# Patient Record
Sex: Female | Born: 1965 | Race: White | Hispanic: No | Marital: Married | Smoking: Never smoker
Health system: Southern US, Community
[De-identification: ages and names within clinical notes are randomized; demographics above are authoritative.]

## PROBLEM LIST (undated history)

## (undated) DIAGNOSIS — J329 Chronic sinusitis, unspecified: Secondary | ICD-10-CM

## (undated) DIAGNOSIS — K219 Gastro-esophageal reflux disease without esophagitis: Secondary | ICD-10-CM

## (undated) DIAGNOSIS — N189 Chronic kidney disease, unspecified: Secondary | ICD-10-CM

## (undated) DIAGNOSIS — I10 Essential (primary) hypertension: Secondary | ICD-10-CM

## (undated) DIAGNOSIS — K5190 Ulcerative colitis, unspecified, without complications: Secondary | ICD-10-CM

## (undated) DIAGNOSIS — F410 Panic disorder [episodic paroxysmal anxiety] without agoraphobia: Secondary | ICD-10-CM

## (undated) DIAGNOSIS — M797 Fibromyalgia: Secondary | ICD-10-CM

## (undated) DIAGNOSIS — F419 Anxiety disorder, unspecified: Secondary | ICD-10-CM

## (undated) DIAGNOSIS — R42 Dizziness and giddiness: Secondary | ICD-10-CM

## (undated) DIAGNOSIS — M069 Rheumatoid arthritis, unspecified: Secondary | ICD-10-CM

## (undated) DIAGNOSIS — M1990 Unspecified osteoarthritis, unspecified site: Secondary | ICD-10-CM

## (undated) DIAGNOSIS — Z86718 Personal history of other venous thrombosis and embolism: Secondary | ICD-10-CM

## (undated) DIAGNOSIS — K589 Irritable bowel syndrome without diarrhea: Secondary | ICD-10-CM

## (undated) DIAGNOSIS — I2699 Other pulmonary embolism without acute cor pulmonale: Secondary | ICD-10-CM

## (undated) DIAGNOSIS — F988 Other specified behavioral and emotional disorders with onset usually occurring in childhood and adolescence: Secondary | ICD-10-CM

## (undated) HISTORY — PX: TONSILLECTOMY: SUR1361

## (undated) HISTORY — DX: Irritable bowel syndrome without diarrhea: K58.9

## (undated) HISTORY — PX: ABDOMINAL HYSTERECTOMY: SHX81

## (undated) HISTORY — DX: Dizziness and giddiness: R42

## (undated) HISTORY — PX: ADENOIDECTOMY: SUR15

## (undated) HISTORY — DX: Personal history of other venous thrombosis and embolism: Z86.718

## (undated) HISTORY — DX: Rheumatoid arthritis, unspecified: M06.9

## (undated) HISTORY — PX: KNEE ARTHROSCOPY: SUR90

## (undated) HISTORY — PX: DIAGNOSTIC LAPAROSCOPY: SUR761

---

## 2006-08-13 HISTORY — PX: NEPHRECTOMY: SHX65

## 2012-01-23 ENCOUNTER — Emergency Department (HOSPITAL_COMMUNITY)
Admission: EM | Admit: 2012-01-23 | Discharge: 2012-01-23 | Disposition: A | Discharge status: Home / Self Care | Payer: BC Managed Care – PPO | Attending: Internal Medicine | Admitting: Internal Medicine

## 2012-01-23 ENCOUNTER — Emergency Department (HOSPITAL_COMMUNITY): Payer: BC Managed Care – PPO

## 2012-01-23 ENCOUNTER — Encounter (HOSPITAL_COMMUNITY): Payer: Self-pay | Admitting: *Deleted

## 2012-01-23 DIAGNOSIS — L02619 Cutaneous abscess of unspecified foot: Secondary | ICD-10-CM | POA: Insufficient documentation

## 2012-01-23 DIAGNOSIS — L03129 Acute lymphangitis of unspecified part of limb: Secondary | ICD-10-CM

## 2012-01-23 DIAGNOSIS — L03039 Cellulitis of unspecified toe: Secondary | ICD-10-CM

## 2012-01-23 DIAGNOSIS — Z79899 Other long term (current) drug therapy: Secondary | ICD-10-CM | POA: Insufficient documentation

## 2012-01-23 DIAGNOSIS — L03115 Cellulitis of right lower limb: Secondary | ICD-10-CM

## 2012-01-23 DIAGNOSIS — F411 Generalized anxiety disorder: Secondary | ICD-10-CM

## 2012-01-23 DIAGNOSIS — B353 Tinea pedis: Secondary | ICD-10-CM

## 2012-01-23 DIAGNOSIS — Z905 Acquired absence of kidney: Secondary | ICD-10-CM

## 2012-01-23 HISTORY — DX: Other pulmonary embolism without acute cor pulmonale: I26.99

## 2012-01-23 HISTORY — DX: Anxiety disorder, unspecified: F41.9

## 2012-01-23 HISTORY — DX: Ulcerative colitis, unspecified, without complications: K51.90

## 2012-01-23 LAB — BASIC METABOLIC PANEL
BUN: 21 mg/dL (ref 6–23)
Creatinine, Ser: 1.18 mg/dL — ABNORMAL HIGH (ref 0.50–1.10)
GFR calc Af Amer: 64 mL/min — ABNORMAL LOW (ref >90)
GFR calc non Af Amer: 55 mL/min — ABNORMAL LOW (ref >90)
Potassium: 4 mEq/L (ref 3.5–5.1)

## 2012-01-23 LAB — DIFFERENTIAL
Basophils Absolute: 0 10*3/uL (ref 0.0–0.1)
Basophils Relative: 0 % (ref 0–1)
Neutro Abs: 6 10*3/uL (ref 1.7–7.7)
Neutrophils Relative %: 63 % (ref 43–77)

## 2012-01-23 LAB — LACTIC ACID, PLASMA: Lactic Acid, Venous: 1.8 mmol/L (ref 0.5–2.2)

## 2012-01-23 LAB — CBC
MCHC: 33.2 g/dL (ref 30.0–36.0)
RDW: 12.4 % (ref 11.5–15.5)

## 2012-01-23 MED ORDER — VANCOMYCIN HCL IN DEXTROSE 1-5 GM/200ML-% IV SOLN
1000.0000 mg | Freq: Once | INTRAVENOUS | Status: AC
  Administered 2012-01-23: 1000 mg via INTRAVENOUS
  Filled 2012-01-23: qty 200

## 2012-01-23 MED ORDER — CLINDAMYCIN PHOSPHATE 600 MG/50ML IV SOLN
600.0000 mg | Freq: Once | INTRAVENOUS | Status: AC
  Administered 2012-01-23: 600 mg via INTRAVENOUS
  Filled 2012-01-23: qty 50

## 2012-01-23 MED ORDER — SODIUM CHLORIDE 0.9 % IV BOLUS (SEPSIS)
500.0000 mL | Freq: Once | INTRAVENOUS | Status: AC
  Administered 2012-01-23: 500 mL via INTRAVENOUS

## 2012-01-23 MED ORDER — SULFAMETHOXAZOLE-TMP DS 800-160 MG PO TABS: 1.0000 | ORAL_TABLET | Freq: Two times a day (BID) | ORAL | Status: DC

## 2012-01-23 MED ORDER — SULFAMETHOXAZOLE-TMP DS 800-160 MG PO TABS
1.0000 | ORAL_TABLET | Freq: Once | ORAL | Status: AC
  Administered 2012-01-23: 1 via ORAL
  Filled 2012-01-23: qty 1

## 2012-01-23 NOTE — ED Notes (Signed)
Dr Orvan Falconer paged for dc papers for said pt. DR Orvan Falconer referred RN to EDP for assistance on needed dc papers.

## 2012-01-23 NOTE — ED Provider Notes (Signed)
History   This chart was scribed for Darlene Anger, DO by [name]. The patient was seen in room [facility]12/[facility]12. Patient's care was started at 1716.   CSN: [id number]  Arrival date & time 01/23/12  1716   First MD Initiated Contact with Patient 01/23/12 1737      Chief Complaint  Patient presents with  . Foot Pain    HPI  Pt seen at 1749.  Per pt and spouse, c/o gradual onset and worsening of persistent right foot "redness" and "swelling" for the past 4 days, worse today.  Pt was eval by her PMD yesterday for same, dx cellulitis, rx doxycycline.  Pt has been taking the antibiotic as rx.  Pt states the redness is now streaking up her leg today.  Denies fevers, no injury, no foot pain.    Past Medical History  Diagnosis Date  . Ulcerative colitis   . Anxiety   . Pulmonary embolism     Past Surgical History  Procedure Date  . Tonsillectomy   . Abdominal hysterectomy   . Nephrectomy     right side    History  Substance Use Topics  . Smoking status: Never Smoker   . Smokeless tobacco: Not on file  . Alcohol Use: No    Review of Systems ROS: Statement: All systems negative except as marked or noted in the HPI; Constitutional: Negative for fever and chills. ; ; Eyes: Negative for eye pain, redness and discharge. ; ; ENMT: Negative for ear pain, hoarseness, nasal congestion, sinus pressure and sore throat. ; ; Cardiovascular: Negative for chest pain, palpitations, diaphoresis, dyspnea and peripheral edema. ; ; Respiratory: Negative for cough, wheezing and stridor. ; ; Gastrointestinal: Negative for nausea, vomiting, diarrhea, abdominal pain, blood in stool, hematemesis, jaundice and rectal bleeding. . ; ; Genitourinary: Negative for dysuria, flank pain and hematuria. ; ; Musculoskeletal: Negative for back pain and neck pain. Negative for swelling and trauma.; ; Skin: +rash. Negative for pruritus, abrasions, blisters, bruising and skin lesion.; ; Neuro: Negative for headache,  lightheadedness and neck stiffness. Negative for weakness, altered level of consciousness , altered mental status, extremity weakness, paresthesias, involuntary movement, seizure and syncope.     Allergies  Penicillins; Shrimp; and Adhesive  Home Medications   Current Outpatient Rx  Name Route Sig Dispense Refill  . ACETAMINOPHEN ER 650 MG PO TBCR Oral Take 1,300 mg by mouth daily as needed.    Marland Kitchen AMITRIPTYLINE HCL 50 MG PO TABS Oral Take 50 mg by mouth at bedtime.    Marland Kitchen CITALOPRAM HYDROBROMIDE 40 MG PO TABS Oral Take 40 mg by mouth at bedtime.    Marland Kitchen DICYCLOMINE HCL 10 MG PO CAPS Oral Take 10 mg by mouth daily as needed.    Marland Kitchen DOXYCYCLINE HYCLATE 100 MG PO CAPS Oral Take 100 mg by mouth 2 (two) times daily.    Marland Kitchen FLUTICASONE PROPIONATE 50 MCG/ACT NA SUSP Nasal Place 2 sprays into the nose daily.    Marland Kitchen LORATADINE-PSEUDOEPHEDRINE ER 10-240 MG PO TB24 Oral Take 1 tablet by mouth daily.    Marland Kitchen LORAZEPAM 0.5 MG PO TABS Oral Take 0.5 mg by mouth 3 (three) times daily as needed.    Marland Kitchen MESALAMINE 800 MG PO TBEC Oral Take 1 tablet by mouth 3 (three) times daily.      BP 118/69  Pulse 97  Temp 98.4 F (36.9 C) (Oral)  Resp 20  Ht 5\' 6"  (1.676 m)  Wt 221 lb (100.245 kg)  BMI 35.67 kg/m2  SpO2 97%  Physical Exam 1750: Physical examination:  Nursing notes reviewed; Vital signs and O2 SAT reviewed;  Constitutional: Well developed, Well nourished, Well hydrated, In no acute distress; Head:  Normocephalic, atraumatic; Eyes: EOMI, PERRL, No scleral icterus; ENMT: Mouth and pharynx normal, Mucous membranes moist; Neck: Supple, Full range of motion, No lymphadenopathy; Cardiovascular: Regular rate and rhythm, No murmur, rub, or gallop; Respiratory: Breath sounds clear & equal bilaterally, No rales, rhonchi, wheezes.  Speaking full sentences with ease, Normal respiratory effort/excursion; Chest: Nontender, Movement normal; Abdomen: Soft, Nontender, Nondistended, Normal bowel sounds; Extremities: Pulses normal,  No tenderness, +small linear moist wound in between right 4th and 5th toes webspace with erythema to both toes, proximal dorsal foot, and streaking up right ankle/lower leg. No calf edema or asymmetry.; Neuro: AA&Ox3, Major CN grossly intact.  Speech clear. No gross focal motor or sensory deficits in extremities.; Skin: Color normal, Warm, Dry.   ED Course  Procedures    MDM  MDM Reviewed: nursing note and vitals Interpretation: labs and x-ray   Results for orders placed during the hospital encounter of 01/23/12  BASIC METABOLIC PANEL      Component Value Range   Sodium 139  135 - 145 mEq/L   Potassium 4.0  3.5 - 5.1 mEq/L   Chloride 101  96 - 112 mEq/L   CO2 27  19 - 32 mEq/L   Glucose, Bld 115 (*) 70 - 99 mg/dL   BUN 21  6 - 23 mg/dL   Creatinine, Ser 1.61 (*) 0.50 - 1.10 mg/dL   Calcium 9.6  8.4 - 09.6 mg/dL   GFR calc non Af Amer 55 (*) >90 mL/min   GFR calc Af Amer 64 (*) >90 mL/min  CBC      Component Value Range   WBC 9.5  4.0 - 10.5 K/uL   RBC 4.45  3.87 - 5.11 MIL/uL   Hemoglobin 13.7  12.0 - 15.0 g/dL   HCT 04.5  40.9 - 81.1 %   MCV 92.8  78.0 - 100.0 fL   MCH 30.8  26.0 - 34.0 pg   MCHC 33.2  30.0 - 36.0 g/dL   RDW 91.4  78.2 - 95.6 %   Platelets 265  150 - 400 K/uL  DIFFERENTIAL      Component Value Range   Neutrophils Relative 63  43 - 77 %   Neutro Abs 6.0  1.7 - 7.7 K/uL   Lymphocytes Relative 29  12 - 46 %   Lymphs Abs 2.7  0.7 - 4.0 K/uL   Monocytes Relative 7  3 - 12 %   Monocytes Absolute 0.6  0.1 - 1.0 K/uL   Eosinophils Relative 1  0 - 5 %   Eosinophils Absolute 0.1  0.0 - 0.7 K/uL   Basophils Relative 0  0 - 1 %   Basophils Absolute 0.0  0.0 - 0.1 K/uL  PROCALCITONIN      Component Value Range   Procalcitonin <0.10    LACTIC ACID, PLASMA      Component Value Range   Lactic Acid, Venous 1.8  0.5 - 2.2 mmol/L   Dg Foot Complete Right 01/23/2012  *RADIOLOGY REPORT*  Clinical Data: Pain, swelling and redness between the fourth and fifth toes.   RIGHT FOOT COMPLETE - 3+ VIEW  Comparison: None.  Findings: No bony destructive change or radiopaque foreign body is identified.  No soft tissue gas collection.  First MTP degenerative change is noted.  No fracture.  Accessory ossicle of the navicular bone is seen.  IMPRESSION: No acute finding.  Original Report Authenticated By: Bernadene Bell. D'ALESSIO, M.D.      7829:  IV clindamycin given due to pt's concern for solitary kidney status.  Pt is failure of outpatient PO therapy for her cellulitis (doxycycline), with new lymphangitis today.   Needs observation admit for IV abx.  Dx testing d/w pt and family.  Questions answered.  Verb understanding, agreeable to admit.  T/C to [facility] Dr. Orvan Falconer, case discussed, including:  HPI, pertinent PM/SHx, VS/PE, dx testing, ED course and treatment:  Agreeable to come to ED for eval to admit.      I personally performed the services described in this documentation, which was scribed in my presence. The recorded information has been reviewed and considered. Aujanae Mccullum Allison Quarry, DO 01/25/12 1230

## 2012-01-23 NOTE — Consult Note (Signed)
Emergency room consult:  Date of consult: 01/23/2012  Physician requesting the consult: Abel Presto.O.  Consulting physician: Vania Rea M.D.   PCP:  Suzy Bouchard, [facility], [place], [place]  Reason for consult: Cellulitis and lymphangitis of the right little toe, failed outpatient treatment.   Impression Cellulitis of the right little toe with early lymphangitis, failed one day of oral doxycycline Tinea pedis right fourth and fifth toe web space History of anxiety and depression Ulcerative colitis History of nephrectomy for hydronephrosis   Recommendations : Give a dose of vancomycin intravenous prior to discharge from the emergency room  Give a dose of oral Bactrim  prior to discharge from the emergency room  Discharge patient home with prescription for oral Bactrim DS, for 7 days, topical Bactroban,  topical Lotrimin twice daily to be continued  for 4 weeks after the infection has resolved  Advise patient returned to the emergency room tomorrow if infection is not improving Advise patient to see her doctor in one week to have her renal function checked Advised patient to drink plenty liquids while on Bactrim Discuss rationale and plans for these recommendations with the patient  All of these recommendations have been acted on by the consulting physician    HPI: Darlene Schwartz is an 46 y.o. female.   Obese Caucasian in fair health, has had pain redness and swelling of the right great toe for the past 4 days. He was seen by her primary care physician yesterday and started on doxycycline twice daily, and so far has taken a total of 3 doses. She comes to the emergency room today because the pain and redness has spread from the right little toe she is having streaks going up the right foot. Denies any tenderness or swelling in the popliteal area or the groin. She also notes a crack in the web space between right fourth and fifth toe. He has had no trauma to the  area.  In the emergency room patient has been treated with Cleocin, history of penicillin allergy, and the hospitalist service has been consulted for admission for management of cellulitis with failed outpatient management.  Rewiew of Systems:  The patient denies anorexia, fever, weight loss,, vision loss, decreased hearing, hoarseness, chest pain, syncope, dyspnea on exertion, peripheral edema, balance deficits, hemoptysis, abdominal pain, melena, hematochezia, severe indigestion/heartburn, hematuria, incontinence, genital sores, muscle weakness, suspicious skin lesions, transient blindness, difficulty walking, depression, unusual weight change, abnormal bleeding, enlarged lymph nodes, angioedema, and breast masses.   Past Medical History  Diagnosis Date  . Ulcerative colitis   . Anxiety   . Pulmonary embolism     Past Surgical History  Procedure Date  . Tonsillectomy   . Abdominal hysterectomy   . Nephrectomy     right side    Medications:  HOME MEDS: Prior to Admission medications   Medication Sig Start Date End Date Taking? Authorizing Provider  acetaminophen (TYLENOL) 650 MG CR tablet Take 1,300 mg by mouth daily as needed.   Yes Historical Provider, MD  amitriptyline (ELAVIL) 50 MG tablet Take 50 mg by mouth at bedtime.   Yes Historical Provider, MD  citalopram (CELEXA) 40 MG tablet Take 40 mg by mouth at bedtime.   Yes Historical Provider, MD  dicyclomine (BENTYL) 10 MG capsule Take 10 mg by mouth daily as needed.   Yes Historical Provider, MD  doxycycline (VIBRAMYCIN) 100 MG capsule Take 100 mg by mouth 2 (two) times daily. 01/22/12  Yes Historical Provider, MD  fluticasone (  FLONASE) 50 MCG/ACT nasal spray Place 2 sprays into the nose daily.   Yes Historical Provider, MD  loratadine-pseudoephedrine (CLARITIN-D 24-HOUR) 10-240 MG per 24 hr tablet Take 1 tablet by mouth daily.   Yes Historical Provider, MD  LORazepam (ATIVAN) 0.5 MG tablet Take 0.5 mg by mouth 3 (three) times  daily as needed.   Yes Historical Provider, MD  Mesalamine (ASACOL HD) 800 MG TBEC Take 1 tablet by mouth 3 (three) times daily.   Yes Historical Provider, MD     Allergies:  Allergies  Allergen Reactions  . Penicillins Itching  . Shrimp (Shellfish Allergy) Itching  . Adhesive (Tape) Rash    Social History:   reports that she has never smoked. She does not have any smokeless tobacco history on file. She reports that she does not drink alcohol or use illicit drugs.  Family History: No family history on file.   Physical Exam: Filed Vitals:   01/23/12 1724 01/23/12 1933  BP: 118/69 110/73  Pulse: 97 79  Temp: 98.4 F (36.9 C)   TempSrc: Oral   Resp: 20 20  Height: 5\' 6"  (1.676 m)   Weight: 100.245 kg (221 lb)   SpO2: 97% 100%   Blood pressure 110/73, pulse 79, temperature 98.4 F (36.9 C), temperature source Oral, resp. rate 20, height 5\' 6"  (1.676 m), weight 100.245 kg (221 lb), SpO2 100.00%.  GEN:  Pleasant Caucasian lady lying in the stretcher in no acute distress; cooperative with exam PSYCH:  alert and oriented x4; does appear a little anxious but affect is appropriate. HEENT: Mucous membranes pink and anicteric; PERRLA; EOM intact; no cervical lymphadenopathy nor thyromegaly or carotid bruit; no JVD; Breasts:: Not examined CHEST WALL: No tenderness CHEST: Normal respiration, clear to auscultation bilaterally HEART: Regular rate and rhythm; no murmurs rubs or gallops BACK: No kyphosis or scoliosis; no CVA tenderness ABDOMEN: Obese, soft non-tender; no masses, no organomegaly, normal abdominal bowel sounds; Rectal Exam: Not done EXTREMITIES: Red swollen tender right little toe; pale red streak up the right foot about 5 cm;  lacerated skin between the right fourth and fifth toe; no leg edema; no inguinal lymphadenopathy  Genitalia: not examined PULSES: 2+ and symmetric SKIN: Normal hydration no rash or ulceration CNS: Cranial nerves 2-12 grossly intact no focal  lateralizing neurologic deficit   Labs & Imaging Results for orders placed during the hospital encounter of 01/23/12 (from the past 48 hour(s))  LACTIC ACID, PLASMA     Status: Normal   Collection Time   01/23/12  6:35 PM      Component Value Range Comment   Lactic Acid, Venous 1.8  0.5 - 2.2 mmol/L   BASIC METABOLIC PANEL     Status: Abnormal   Collection Time   01/23/12  6:40 PM      Component Value Range Comment   Sodium 139  135 - 145 mEq/L    Potassium 4.0  3.5 - 5.1 mEq/L    Chloride 101  96 - 112 mEq/L    CO2 27  19 - 32 mEq/L    Glucose, Bld 115 (*) 70 - 99 mg/dL    BUN 21  6 - 23 mg/dL    Creatinine, Ser 1.61 (*) 0.50 - 1.10 mg/dL    Calcium 9.6  8.4 - 09.6 mg/dL    GFR calc non Af Amer 55 (*) >90 mL/min    GFR calc Af Amer 64 (*) >90 mL/min   CBC     Status: Normal  Collection Time   01/23/12  6:40 PM      Component Value Range Comment   WBC 9.5  4.0 - 10.5 K/uL    RBC 4.45  3.87 - 5.11 MIL/uL    Hemoglobin 13.7  12.0 - 15.0 g/dL    HCT 62.1  30.8 - 65.7 %    MCV 92.8  78.0 - 100.0 fL    MCH 30.8  26.0 - 34.0 pg    MCHC 33.2  30.0 - 36.0 g/dL    RDW 84.6  96.2 - 95.2 %    Platelets 265  150 - 400 K/uL   DIFFERENTIAL     Status: Normal   Collection Time   01/23/12  6:40 PM      Component Value Range Comment   Neutrophils Relative 63  43 - 77 %    Neutro Abs 6.0  1.7 - 7.7 K/uL    Lymphocytes Relative 29  12 - 46 %    Lymphs Abs 2.7  0.7 - 4.0 K/uL    Monocytes Relative 7  3 - 12 %    Monocytes Absolute 0.6  0.1 - 1.0 K/uL    Eosinophils Relative 1  0 - 5 %    Eosinophils Absolute 0.1  0.0 - 0.7 K/uL    Basophils Relative 0  0 - 1 %    Basophils Absolute 0.0  0.0 - 0.1 K/uL   PROCALCITONIN     Status: Normal   Collection Time   01/23/12  6:40 PM      Component Value Range Comment   Procalcitonin <0.10      Dg Foot Complete Right  01/23/2012  *RADIOLOGY REPORT*  Clinical Data: Pain, swelling and redness between the fourth and fifth toes.  RIGHT FOOT  COMPLETE - 3+ VIEW  Comparison: None.  Findings: No bony destructive change or radiopaque foreign body is identified.  No soft tissue gas collection.  First MTP degenerative change is noted.  No fracture.  Accessory ossicle of the navicular bone is seen.  IMPRESSION: No acute finding.  Original Report Authenticated By: Bernadene Bell. Maricela Curet, M.D.       Total time spent caring for this patient:: 45 minutes.   Darlene Schwartz 01/23/2012, 8:12 PM

## 2012-01-23 NOTE — ED Notes (Signed)
Right foot infection x 4 days.  Reports redness/swelling in between 4th and 5th right toes.  Seen at [facility] yesterday and given PO abx, pt reports worse today.

## 2012-01-23 NOTE — ED Notes (Addendum)
Spoke with Dr. Orvan Falconer:  Evaluation completed by Dr. Orvan Falconer. (See Consult notes)  Order received to discharge following infusion of IV antibiotics.   Per Dr. Blair Dolphin recommendations, pt to be discharged with prescription for Bactrim for 7 days. Continue Lotrimin cream twice daily.  Follow up with PCP in 1 week.  Return to ED for worsening symptoms.

## 2012-03-13 ENCOUNTER — Encounter (HOSPITAL_COMMUNITY): Payer: Self-pay | Admitting: *Deleted

## 2012-03-13 ENCOUNTER — Other Ambulatory Visit: Payer: Self-pay

## 2012-03-13 ENCOUNTER — Emergency Department (HOSPITAL_COMMUNITY)
Admission: EM | Admit: 2012-03-13 | Discharge: 2012-03-14 | Disposition: A | Discharge status: Home / Self Care | Payer: BC Managed Care – PPO | Attending: Emergency Medicine | Admitting: Emergency Medicine

## 2012-03-13 DIAGNOSIS — F411 Generalized anxiety disorder: Secondary | ICD-10-CM | POA: Insufficient documentation

## 2012-03-13 DIAGNOSIS — E669 Obesity, unspecified: Secondary | ICD-10-CM | POA: Insufficient documentation

## 2012-03-13 DIAGNOSIS — Z79899 Other long term (current) drug therapy: Secondary | ICD-10-CM | POA: Insufficient documentation

## 2012-03-13 DIAGNOSIS — R071 Chest pain on breathing: Secondary | ICD-10-CM | POA: Insufficient documentation

## 2012-03-13 DIAGNOSIS — R0789 Other chest pain: Secondary | ICD-10-CM

## 2012-03-13 DIAGNOSIS — IMO0001 Reserved for inherently not codable concepts without codable children: Secondary | ICD-10-CM | POA: Insufficient documentation

## 2012-03-13 HISTORY — DX: Panic disorder (episodic paroxysmal anxiety): F41.0

## 2012-03-13 HISTORY — DX: Fibromyalgia: M79.7

## 2012-03-13 HISTORY — DX: Chronic sinusitis, unspecified: J32.9

## 2012-03-13 LAB — BASIC METABOLIC PANEL
CO2: 26 mEq/L (ref 19–32)
Calcium: 9.8 mg/dL (ref 8.4–10.5)
GFR calc Af Amer: 71 mL/min — ABNORMAL LOW (ref >90)
Sodium: 140 mEq/L (ref 135–145)

## 2012-03-13 LAB — CBC WITH DIFFERENTIAL/PLATELET
Basophils Absolute: 0 10*3/uL (ref 0.0–0.1)
Basophils Relative: 0 % (ref 0–1)
Eosinophils Relative: 2 % (ref 0–5)
Lymphocytes Relative: 41 % (ref 12–46)
MCV: 92.6 fL (ref 78.0–100.0)
Neutro Abs: 3.2 10*3/uL (ref 1.7–7.7)
Platelets: 292 10*3/uL (ref 150–400)
RDW: 12.9 % (ref 11.5–15.5)
WBC: 6.6 10*3/uL (ref 4.0–10.5)

## 2012-03-13 LAB — URINALYSIS, ROUTINE W REFLEX MICROSCOPIC
Bilirubin Urine: NEGATIVE
Glucose, UA: NEGATIVE mg/dL
Ketones, ur: NEGATIVE mg/dL
pH: 6 (ref 5.0–8.0)

## 2012-03-13 LAB — POCT I-STAT TROPONIN I

## 2012-03-13 LAB — PREGNANCY, URINE: Preg Test, Ur: NEGATIVE

## 2012-03-13 LAB — URINE MICROSCOPIC-ADD ON

## 2012-03-13 NOTE — ED Provider Notes (Signed)
History     CSN: [id number]  Arrival date & time 03/13/12  4098   First MD Initiated Contact with Patient 03/13/12 2309      Chief Complaint  Patient presents with  . Chest Pain    (Consider location/radiation/quality/duration/timing/severity/associated sxs/prior treatment) Patient is a 46 y.o. female presenting with chest pain. The history is provided by the patient. No language interpreter was used.  Chest Pain The chest pain began 1 - 2 weeks ago. Chest pain occurs frequently. The chest pain is worsening. Associated with: none. At its most intense, the pain is at 9/10. The pain is currently at 9/10. The severity of the pain is severe. The quality of the pain is described as sharp. The pain does not radiate. Pertinent negatives for primary symptoms include no fever and no shortness of breath.  Pertinent negatives for associated symptoms include no diaphoresis. She tried nothing for the symptoms. Risk factors include obesity.  Pertinent negatives for past medical history include no MI.  Procedure history is negative for cardiac catheterization.     Past Medical History  Diagnosis Date  . Ulcerative colitis   . Anxiety   . Pulmonary embolism   . Anxiety   . Panic attacks   . Fibromyalgia   . Chronic sinus infection     Past Surgical History  Procedure Date  . Tonsillectomy   . Abdominal hysterectomy   . Nephrectomy     right side    History reviewed. No pertinent family history.  History  Substance Use Topics  . Smoking status: Never Smoker   . Smokeless tobacco: Not on file  . Alcohol Use: No    OB History    Grav Para Term Preterm Abortions TAB SAB Ect Mult Living                  Review of Systems  Constitutional: Negative for fever and diaphoresis.  HENT: Negative for neck pain and neck stiffness.   Respiratory: Negative for shortness of breath.   Cardiovascular: Positive for chest pain.  Hematological: Negative for adenopathy.  All other systems  reviewed and are negative.    Allergies  Penicillins; Shrimp; and Adhesive  Home Medications   Current Outpatient Rx  Name Route Sig Dispense Refill  . ACETAMINOPHEN ER 650 MG PO TBCR Oral Take 1,300 mg by mouth daily as needed.    Marland Kitchen AMITRIPTYLINE HCL 50 MG PO TABS Oral Take 50 mg by mouth at bedtime.    Marland Kitchen VITAMIN D 1000 UNITS PO TABS Oral Take 1,000 Units by mouth daily.    Marland Kitchen CITALOPRAM HYDROBROMIDE 40 MG PO TABS Oral Take 40 mg by mouth at bedtime.    Marland Kitchen DICYCLOMINE HCL 10 MG PO CAPS Oral Take 10 mg by mouth daily as needed.    Marland Kitchen FLUTICASONE PROPIONATE 50 MCG/ACT NA SUSP Nasal Place 2 sprays into the nose daily.    Marland Kitchen LORATADINE-PSEUDOEPHEDRINE ER 10-240 MG PO TB24 Oral Take 1 tablet by mouth daily.    Marland Kitchen LORAZEPAM 0.5 MG PO TABS Oral Take 0.5 mg by mouth 3 (three) times daily as needed.    Marland Kitchen MESALAMINE 800 MG PO TBEC Oral Take 1 tablet by mouth 3 (three) times daily.    . [facility] WORT PO Oral Take 1 tablet by mouth daily.      BP 132/83  Pulse 98  Temp 99.3 F (37.4 C) (Oral)  Resp 16  SpO2 100%  Physical Exam  Constitutional: She is oriented to  person, place, and time. She appears well-developed and well-nourished. No distress.  HENT:  Head: Normocephalic and atraumatic.  Mouth/Throat: Oropharynx is clear and moist.  Eyes: Conjunctivae are normal. Pupils are equal, round, and reactive to light.  Neck: Normal range of motion. Neck supple.  Cardiovascular: Normal rate and regular rhythm.   Pulmonary/Chest: Effort normal and breath sounds normal. She has no wheezes. She has no rales. She exhibits tenderness.  Abdominal: Soft. Bowel sounds are normal. There is no tenderness. There is no rebound and no guarding.  Musculoskeletal: Normal range of motion. She exhibits no edema.  Neurological: She is alert and oriented to person, place, and time. She has normal reflexes.  Skin: Skin is warm and dry. She is not diaphoretic.  Psychiatric: She has a normal mood and affect.    ED  Course  Procedures (including critical care time)  Labs Reviewed  BASIC METABOLIC PANEL - Abnormal; Notable for the following:    GFR calc non Af Amer 61 (*)     GFR calc Af Amer 71 (*)     All other components within normal limits  URINALYSIS, ROUTINE W REFLEX MICROSCOPIC - Abnormal; Notable for the following:    Leukocytes, UA TRACE (*)     All other components within normal limits  CBC WITH DIFFERENTIAL  POCT I-STAT TROPONIN I  URINE MICROSCOPIC-ADD ON  PREGNANCY, URINE  D-DIMER, QUANTITATIVE   No results found.   No diagnosis found.    MDM   Date: 03/13/2012  Rate: 103  Rhythm: sinus tachycardia  QRS Axis: left  Intervals: normal  ST/T Wave abnormalities: normal  Conduction Disutrbances:none  Narrative Interpretation:   Old EKG Reviewed: none available       Clearly MSk pain.  Labs and radiology studies are normal.  There is also an anxiety component.  Return for chest pain, shortness of breath, or any concerns.  Follow up with your family doctor.  Patient verbalizes understanding and agrees to follow up  Arhianna Ebey Smitty Cords, MD 03/14/12 [phone]

## 2012-03-13 NOTE — ED Notes (Signed)
Pt sts pain in her chest, lymp nodes, and extreme anxiety.

## 2012-03-13 NOTE — ED Notes (Signed)
Pt reports x2 weeks intermittent chest heaviness/tightness associated w/ bilat arm tingling. Pt admits to hx of panic attacks and states she feels they have increased in intensity and frequency. Pt takes medication for anxiety however does not feel like it is working, pt states she has woken up in a panic attack and also does not have any known stressors at the onset of panic attacks. Pt in no acute distress at present, resp even and unlabored.

## 2012-03-14 ENCOUNTER — Emergency Department (HOSPITAL_COMMUNITY): Payer: BC Managed Care – PPO

## 2012-03-14 ENCOUNTER — Encounter (HOSPITAL_COMMUNITY): Payer: Self-pay

## 2012-03-14 LAB — POCT I-STAT TROPONIN I: Troponin i, poc: 0 ng/mL (ref 0.00–0.08)

## 2012-03-14 MED ORDER — TECHNETIUM TO 99M ALBUMIN AGGREGATED
3.3000 | Freq: Once | INTRAVENOUS | Status: AC | PRN
  Administered 2012-03-14: 3 via INTRAVENOUS

## 2012-03-14 MED ORDER — ASPIRIN 81 MG PO CHEW
324.0000 mg | CHEWABLE_TABLET | Freq: Once | ORAL | Status: AC
  Administered 2012-03-14: 324 mg via ORAL
  Filled 2012-03-14: qty 4

## 2012-03-14 MED ORDER — IBUPROFEN 600 MG PO TABS: 600.0000 mg | ORAL_TABLET | Freq: Four times a day (QID) | ORAL | Status: AC | PRN

## 2013-01-13 ENCOUNTER — Emergency Department (HOSPITAL_COMMUNITY)
Admission: EM | Admit: 2013-01-13 | Discharge: 2013-01-13 | Disposition: A | Discharge status: Home / Self Care | Payer: 59 | Attending: Emergency Medicine | Admitting: Emergency Medicine

## 2013-01-13 ENCOUNTER — Encounter (HOSPITAL_COMMUNITY): Payer: Self-pay

## 2013-01-13 ENCOUNTER — Emergency Department (HOSPITAL_COMMUNITY): Payer: 59

## 2013-01-13 DIAGNOSIS — IMO0001 Reserved for inherently not codable concepts without codable children: Secondary | ICD-10-CM | POA: Insufficient documentation

## 2013-01-13 DIAGNOSIS — Z79899 Other long term (current) drug therapy: Secondary | ICD-10-CM | POA: Insufficient documentation

## 2013-01-13 DIAGNOSIS — R5381 Other malaise: Secondary | ICD-10-CM | POA: Insufficient documentation

## 2013-01-13 DIAGNOSIS — K5190 Ulcerative colitis, unspecified, without complications: Secondary | ICD-10-CM | POA: Insufficient documentation

## 2013-01-13 DIAGNOSIS — IMO0002 Reserved for concepts with insufficient information to code with codable children: Secondary | ICD-10-CM | POA: Insufficient documentation

## 2013-01-13 DIAGNOSIS — Z86711 Personal history of pulmonary embolism: Secondary | ICD-10-CM | POA: Insufficient documentation

## 2013-01-13 DIAGNOSIS — R5383 Other fatigue: Secondary | ICD-10-CM | POA: Insufficient documentation

## 2013-01-13 DIAGNOSIS — R42 Dizziness and giddiness: Secondary | ICD-10-CM

## 2013-01-13 DIAGNOSIS — F411 Generalized anxiety disorder: Secondary | ICD-10-CM | POA: Insufficient documentation

## 2013-01-13 DIAGNOSIS — Z880 Allergy status to penicillin: Secondary | ICD-10-CM | POA: Insufficient documentation

## 2013-01-13 DIAGNOSIS — Z8709 Personal history of other diseases of the respiratory system: Secondary | ICD-10-CM | POA: Insufficient documentation

## 2013-01-13 LAB — CBC WITH DIFFERENTIAL/PLATELET
Basophils Absolute: 0 10*3/uL (ref 0.0–0.1)
Basophils Relative: 0 % (ref 0–1)
MCHC: 33.6 g/dL (ref 30.0–36.0)
Neutro Abs: 5.4 10*3/uL (ref 1.7–7.7)
Neutrophils Relative %: 66 % (ref 43–77)
RDW: 12.6 % (ref 11.5–15.5)

## 2013-01-13 LAB — COMPREHENSIVE METABOLIC PANEL
AST: 19 U/L (ref 0–37)
Albumin: 3.9 g/dL (ref 3.5–5.2)
Alkaline Phosphatase: 88 U/L (ref 39–117)
Chloride: 102 mEq/L (ref 96–112)
Creatinine, Ser: 1.14 mg/dL — ABNORMAL HIGH (ref 0.50–1.10)
Potassium: 4.1 mEq/L (ref 3.5–5.1)
Total Bilirubin: 0.6 mg/dL (ref 0.3–1.2)

## 2013-01-13 MED ORDER — PREDNISONE 10 MG PO TABS: 20.0000 mg | ORAL_TABLET | Freq: Every day | ORAL | Status: DC

## 2013-01-13 MED ORDER — MECLIZINE HCL 25 MG PO TABS: ORAL_TABLET | ORAL | Status: DC

## 2013-01-13 NOTE — ED Provider Notes (Signed)
History     CSN: [id number]  Arrival date & time 01/13/13  1621   First MD Initiated Contact with Patient 01/13/13 1644      Chief Complaint  Patient presents with  . Dizziness    (Consider location/radiation/quality/duration/timing/severity/associated sxs/prior treatment) Patient is a 47 y.o. female presenting with weakness. The history is provided by the patient (the pt states that she has episodes of dizziness recently.  she states the room spins). No language interpreter was used.  Weakness This is a new problem. The current episode started 3 to 5 hours ago. The problem occurs every several days. The problem has not changed since onset.Pertinent negatives include no chest pain, no abdominal pain and no headaches. Nothing aggravates the symptoms. Nothing relieves the symptoms.    Past Medical History  Diagnosis Date  . Ulcerative colitis   . Anxiety   . Pulmonary embolism   . Anxiety   . Panic attacks   . Fibromyalgia   . Chronic sinus infection     Past Surgical History  Procedure Laterality Date  . Tonsillectomy    . Abdominal hysterectomy    . Nephrectomy      right side    No family history on file.  History  Substance Use Topics  . Smoking status: Never Smoker   . Smokeless tobacco: Not on file  . Alcohol Use: No    OB History   Grav Para Term Preterm Abortions TAB SAB Ect Mult Living                  Review of Systems  Constitutional: Negative for appetite change and fatigue.  HENT: Negative for congestion, sinus pressure and ear discharge.   Eyes: Negative for discharge.  Respiratory: Negative for cough.   Cardiovascular: Negative for chest pain.  Gastrointestinal: Negative for abdominal pain and diarrhea.  Genitourinary: Negative for frequency and hematuria.  Musculoskeletal: Negative for back pain.  Skin: Negative for rash.  Neurological: Positive for weakness. Negative for seizures and headaches.  Psychiatric/Behavioral: Negative for  hallucinations.    Allergies  Penicillins; Shrimp; and Adhesive  Home Medications   Current Outpatient Rx  Name  Route  Sig  Dispense  Refill  . acetaminophen (TYLENOL) 650 MG CR tablet   Oral   Take 1,300 mg by mouth daily as needed for pain.          Marland Kitchen amitriptyline (ELAVIL) 50 MG tablet   Oral   Take 50 mg by mouth at bedtime.         Marland Kitchen atenolol (TENORMIN) 25 MG tablet   Oral   Take 25 mg by mouth at bedtime.         . citalopram (CELEXA) 40 MG tablet   Oral   Take 40 mg by mouth at bedtime.         . dicyclomine (BENTYL) 10 MG capsule   Oral   Take 10 mg by mouth daily as needed.         . fluticasone (FLONASE) 50 MCG/ACT nasal spray   Nasal   Place 2 sprays into the nose daily.         Marland Kitchen loratadine (CLARITIN REDITABS) 10 MG dissolvable tablet   Oral   Take 10 mg by mouth every morning.         Marland Kitchen LORazepam (ATIVAN) 0.5 MG tablet   Oral   Take 0.5 mg by mouth 3 (three) times daily as needed for anxiety.          Marland Kitchen  mesalamine (CANASA) 1000 MG suppository   Rectal   Place 1,000 mg rectally daily as needed.         . mesalamine (PENTASA) 250 MG CR capsule   Oral   Take 500 mg by mouth 2 (two) times daily.         . Pseudoephedrine-APAP-DM (DAYQUIL MULTI-SYMPTOM COLD/FLU PO)   Oral   Take 2 capsules by mouth daily as needed (for symptoms).         . loratadine-pseudoephedrine (CLARITIN-D 24-HOUR) 10-240 MG per 24 hr tablet   Oral   Take 1 tablet by mouth daily.         . meclizine (ANTIVERT) 25 MG tablet      Take one every 6 hours as needed for dizziness   15 tablet   0   . predniSONE (DELTASONE) 10 MG tablet   Oral   Take 2 tablets (20 mg total) by mouth daily.   14 tablet   0     BP 127/81  Pulse 112  Temp(Src) 98.6 F (37 C) (Oral)  Resp 18  Ht 5\' 6"  (1.676 m)  Wt 230 lb (104.327 kg)  BMI 37.14 kg/m2  SpO2 100%  Physical Exam  Constitutional: She is oriented to person, place, and time. She appears  well-developed.  HENT:  Head: Normocephalic.  Eyes: Conjunctivae and EOM are normal. No scleral icterus.  Neck: Neck supple. No thyromegaly present.  Cardiovascular: Normal rate and regular rhythm.  Exam reveals no gallop and no friction rub.   No murmur heard. Pulmonary/Chest: No stridor. She has no wheezes. She has no rales. She exhibits no tenderness.  Abdominal: She exhibits no distension. There is no tenderness. There is no rebound.  Musculoskeletal: Normal range of motion. She exhibits no edema.  Lymphadenopathy:    She has no cervical adenopathy.  Neurological: She is oriented to person, place, and time. Coordination normal.  Skin: No rash noted. No erythema.  Psychiatric: She has a normal mood and affect. Her behavior is normal.    ED Course  Procedures (including critical care time)  Labs Reviewed  COMPREHENSIVE METABOLIC PANEL - Abnormal; Notable for the following:    Glucose, Bld 129 (*)    Creatinine, Ser 1.14 (*)    GFR calc non Af Amer 57 (*)    GFR calc Af Amer 66 (*)    All other components within normal limits  CBC WITH DIFFERENTIAL   Ct Head Wo Contrast  01/13/2013   *RADIOLOGY REPORT*  Clinical Data:  History of intermittent dizziness over 3 weeks.  To spells of dizziness today.  Also complaints of nausea.  CT HEAD WITHOUT CONTRAST  Technique: Contiguous axial images were obtained from the base of the skull through the vertex without contrast  Comparison:  None  Findings:  There is no evidence of brain mass, brain hemorrhage, or acute infarction.  The ventricular system is normal size and shape.  There is no evidence of shift of midline structures, parenchymal lesion, or subdural or epidural hematoma.  The calvarium is intact.  Mastoids are well aerated.  No sinusitis is evident.  IMPRESSION: There is no evidence of brain mass, brain hemorrhage, or acute infarction.  No acute or active process is seen.  No skull lesion is evident. No sinusitis is evident.   Original  Report Authenticated By: Onalee Hua Call     1. Vertigo       MDM          Benny Lennert,  MD 01/13/13 1858

## 2013-01-13 NOTE — ED Notes (Signed)
Pt reports intermittent dizziness for the past 3 weeks.  Today had 2 "dizzy spells"  But says they seem to be getting worse.  Pt became nauseated with the 2nd episode of dizziness today.

## 2014-06-08 IMAGING — CR DG FOOT COMPLETE 3+V*R*
3 series · 3 of 3 positions shown · non-contrast
Comparison: None.

CLINICAL DATA: Pain, swelling and redness between the fourth and
fifth toes.

RIGHT FOOT COMPLETE - 3+ VIEW

[view not recorded (1 of 3)]
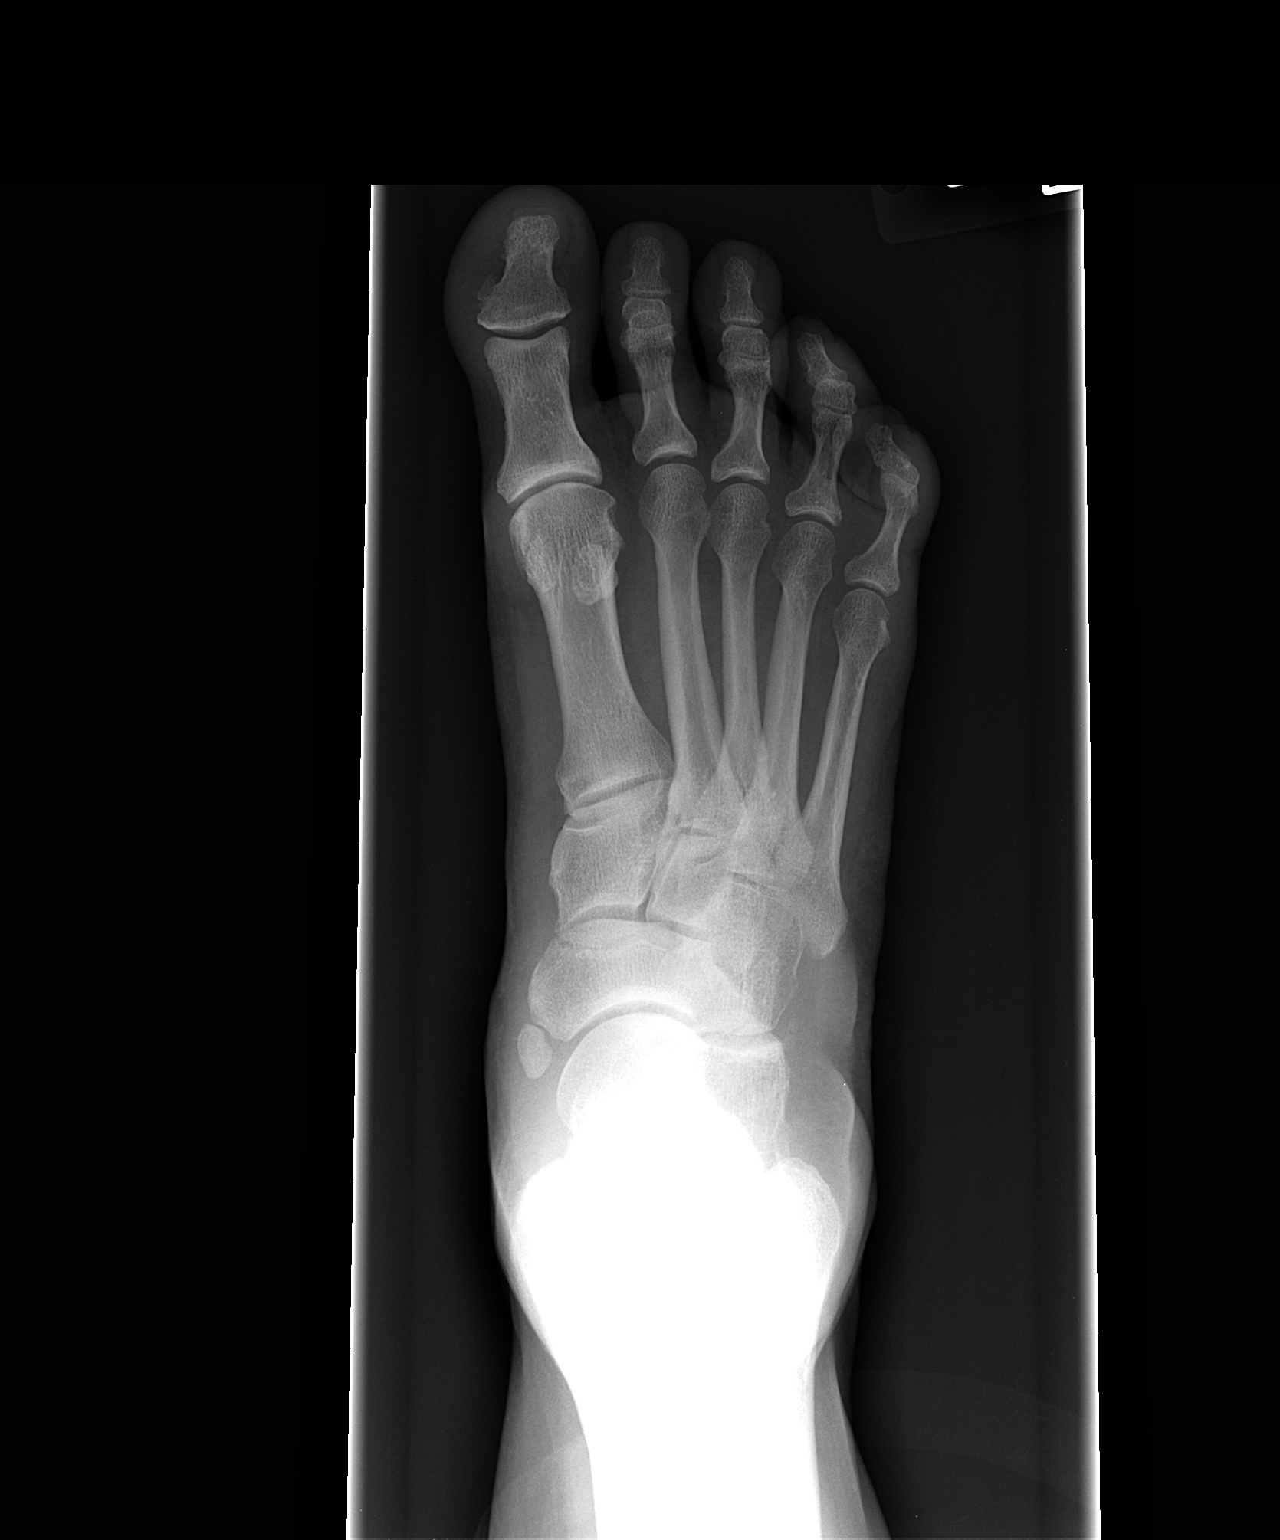

[view not recorded (2 of 3)]
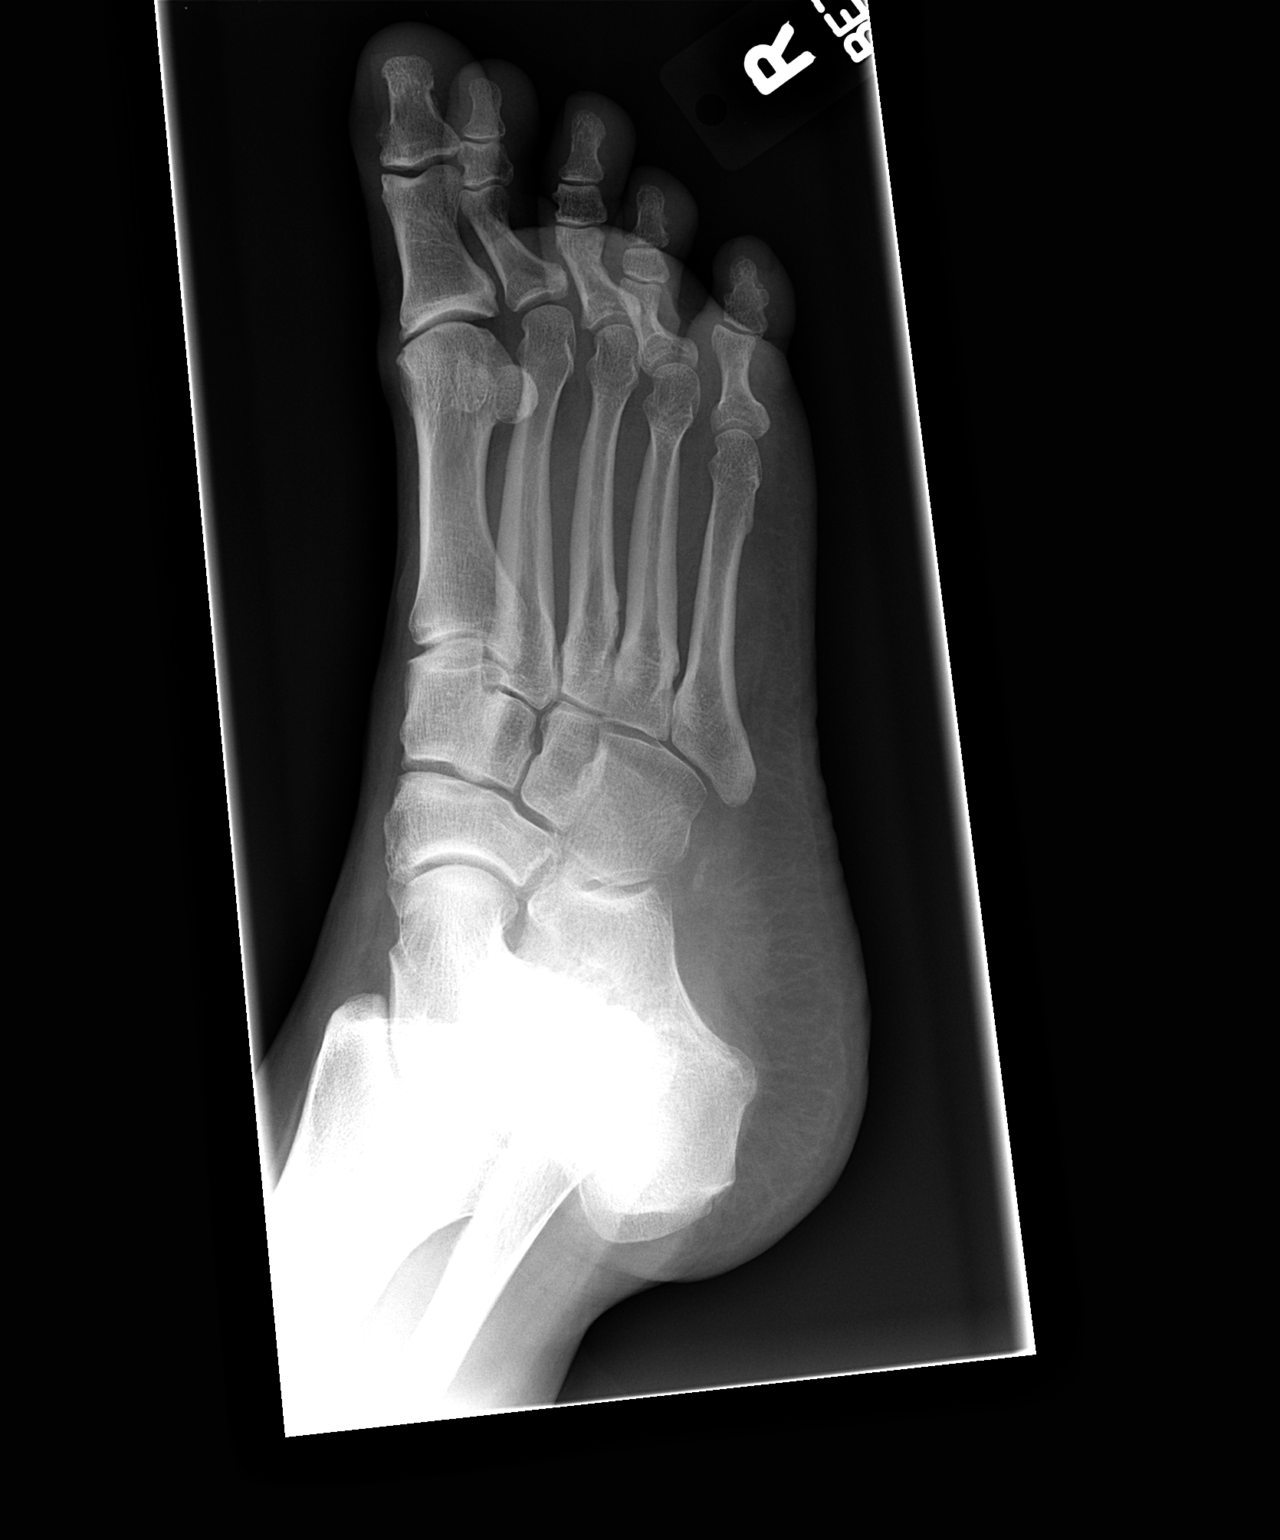

[view not recorded (3 of 3)]
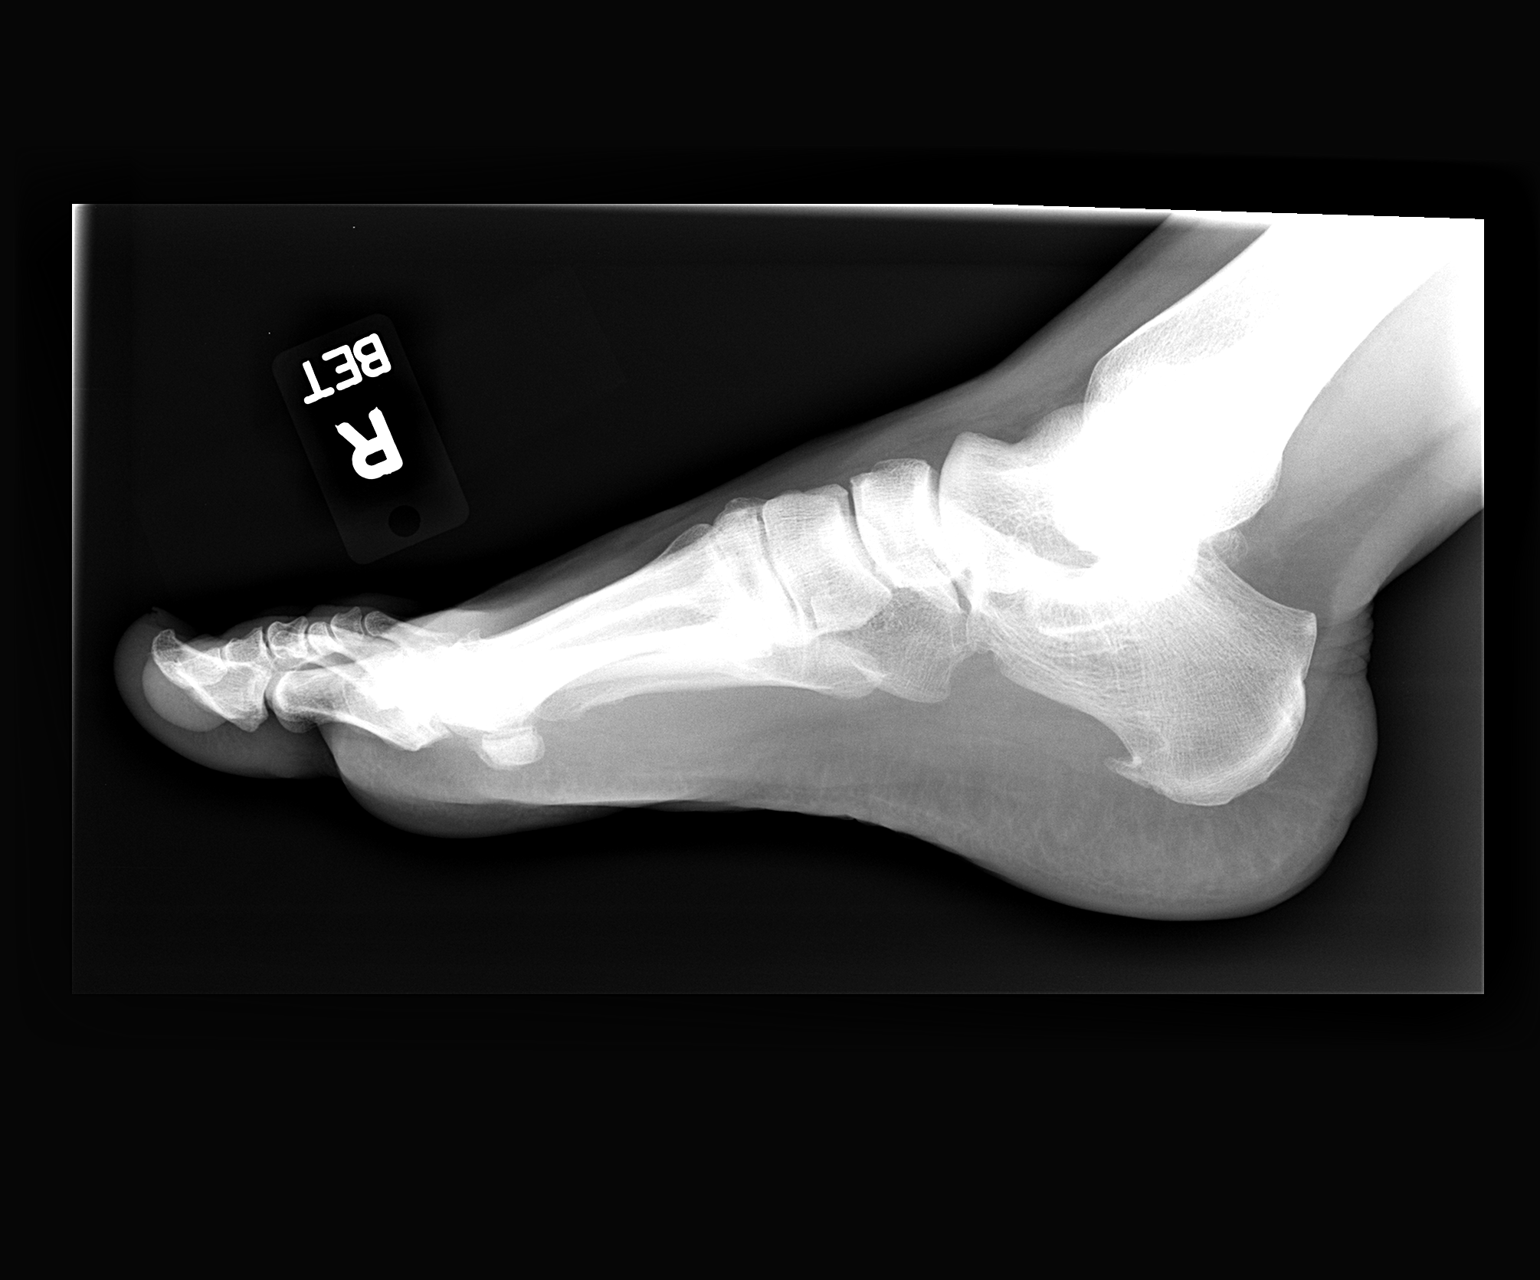

[3 of 3 positions shown; findings below may reference images not displayed]

FINDINGS: No bony destructive change or radiopaque foreign body is
identified.  No soft tissue gas collection.  First MTP degenerative
change is noted.  No fracture.  Accessory ossicle of the navicular
bone is seen.
IMPRESSION: No acute finding.

## 2014-09-23 ENCOUNTER — Ambulatory Visit: Payer: Self-pay | Admitting: Gastroenterology

## 2014-09-27 ENCOUNTER — Ambulatory Visit: Payer: Self-pay | Admitting: Nurse Practitioner

## 2014-09-28 ENCOUNTER — Encounter: Payer: Self-pay | Admitting: Nurse Practitioner

## 2014-10-07 ENCOUNTER — Encounter (INDEPENDENT_AMBULATORY_CARE_PROVIDER_SITE_OTHER): Payer: Self-pay

## 2014-10-07 ENCOUNTER — Ambulatory Visit (INDEPENDENT_AMBULATORY_CARE_PROVIDER_SITE_OTHER): Payer: BLUE CROSS/BLUE SHIELD | Admitting: Nurse Practitioner

## 2014-10-07 ENCOUNTER — Encounter: Payer: Self-pay | Admitting: Nurse Practitioner

## 2014-10-07 VITALS — BP 99/70 | HR 69 | Temp 97.5°F | Ht 66.0 in | Wt 231.2 lb

## 2014-10-07 DIAGNOSIS — K51911 Ulcerative colitis, unspecified with rectal bleeding: Secondary | ICD-10-CM

## 2014-10-07 DIAGNOSIS — K5190 Ulcerative colitis, unspecified, without complications: Secondary | ICD-10-CM | POA: Insufficient documentation

## 2014-10-07 MED ORDER — MESALAMINE 1000 MG RE SUPP: 1000.0000 mg | Freq: Two times a day (BID) | RECTAL | Status: DC

## 2014-10-07 NOTE — Assessment & Plan Note (Signed)
Long-standing ulcerative colitis, currently having a flare of symptoms which responds well to Canasa. Last colonoscopy 2014 consistent with history. Will Rx Canasa 100 mg suppositories bid for 3 weeks to better symptomatic control of UC flare. Follow-up with Dr. Darrick Penna in 4-6 weeks to evaluate long term options for better UC control.

## 2014-10-07 NOTE — Patient Instructions (Signed)
1. Take Canasa suppository twice a day for 3 weeks to get you over the current flare. 2. Follow-up with Dr. Darrick Penna in 3-4 weeks to re-evaluate symptoms and possibly change maintenance medication for better long term control 3. Call if any questions or severe symptoms develop.

## 2014-10-07 NOTE — Progress Notes (Signed)
Primary Care Physician:  Zachery Dauer, MD Primary Gastroenterologist:  Dr. Darrick Penna  Chief Complaint  Patient presents with  . Ulcerative Colitis    Transferring from [place]    HPI:   49 year old female with a history of distal colonic ulcerative colitis presents to establish care. Is changing from [facility] to be with the same GI practice as her mother. States she was diagnosed with UC as a child and has struggled with the disease her whole life. Has a history of nephrectomy as well as other autoimmune manifestations, is seeing Rheumatology for RA. Her disease is limited to the distal colon and rectum, is currently on Asachol. pentasa was attempted but denied by insurance before she could be on it long enough to evaluate effectiveness. Canasa suppositories tend to provide good relief. Currently takes Asachol bid as if she takes it tid it gives her severe headaches. She states her symptoms come and go, is currently in a flare. Is not satisfied with her long term control and she has frequent flares. "I think I'm always somewhat in a flare, but it gets really bad about 4-5 times a year." Her constant symptoms include fecal urgency, up to 4-5 bowel movements in rapid sucession followed by chills, abdominal bloating and discomfort/pressure. Her flare symptoms include increased bloated/bladder discomfort, worsening pain, bleeding with brbpr, and joint pain. Sometimes symptoms are severe enough where she has to stay in bed for half a day to recover. Denies any melena. Previous MD did not provide any change in treatmetn in response to flares, just maintenance of chronic therapy. Takes nexium OTC for GERD as needed, feels her symptoms are well controlled. Occasionally has to take po prednisone for RA which she states helps her UC symptoms, but it's nto a medicine she would want to take long term. Currently denies fever, chills, chest pain, shortness of breath, unintentional weight loss, change in  appetite. Last colonoscopy 2014 with pathology showing chronic inflammatory change of cecum and rectum. Path in 2013 with severe chronic active colitis of rectum with cecal AC, TC with no pathologic changes. Extensive records receive to be reviewed and added as addendum.  Past Medical History  Diagnosis Date  . Ulcerative colitis   . Anxiety   . Pulmonary embolism   . Anxiety   . Panic attacks   . Fibromyalgia   . Chronic sinus infection     Past Surgical History  Procedure Laterality Date  . Tonsillectomy    . Abdominal hysterectomy    . Nephrectomy      right side    Current Outpatient Prescriptions  Medication Sig Dispense Refill  . acetaminophen (TYLENOL) 650 MG CR tablet Take 1,300 mg by mouth daily as needed for pain.     Marland Kitchen amitriptyline (ELAVIL) 50 MG tablet Take 50 mg by mouth at bedtime.    Marland Kitchen atenolol (TENORMIN) 25 MG tablet Take 50 mg by mouth 2 (two) times daily.     . citalopram (CELEXA) 40 MG tablet Take 20 mg by mouth at bedtime.     . dicyclomine (BENTYL) 10 MG capsule Take 10 mg by mouth daily as needed.    Marland Kitchen esomeprazole (NEXIUM) 20 MG capsule Take 20 mg by mouth 2 (two) times daily before a meal.    . fluticasone (FLONASE) 50 MCG/ACT nasal spray Place 2 sprays into the nose daily.    Marland Kitchen loratadine-pseudoephedrine (CLARITIN-D 24-HOUR) 10-240 MG per 24 hr tablet Take 1 tablet by mouth daily.    Marland Kitchen  LORazepam (ATIVAN) 0.5 MG tablet Take 0.5 mg by mouth 3 (three) times daily as needed for anxiety.     . methylphenidate (RITALIN) 5 MG tablet Take 5 mg by mouth 2 (two) times daily.    . NON FORMULARY Asacol 800 mg    One tablet bid    . NON FORMULARY Zana Flex  4 mg     One tablet qhs    . NON FORMULARY Cortizone injections tri monthly   In hip joints    . loratadine (CLARITIN REDITABS) 10 MG dissolvable tablet Take 10 mg by mouth every morning.    . meclizine (ANTIVERT) 25 MG tablet Take one every 6 hours as needed for dizziness (Patient not taking: Reported on  10/07/2014) 15 tablet 0  . mesalamine (CANASA) 1000 MG suppository Place 1,000 mg rectally daily as needed.    . mesalamine (PENTASA) 250 MG CR capsule Take 500 mg by mouth 2 (two) times daily.    . predniSONE (DELTASONE) 10 MG tablet Take 2 tablets (20 mg total) by mouth daily. (Patient taking differently: Take 10 mg by mouth daily. Takes 1/2 of 10 mg tablet daily) 14 tablet 0  . Pseudoephedrine-APAP-DM (DAYQUIL MULTI-SYMPTOM COLD/FLU PO) Take 2 capsules by mouth daily as needed (for symptoms).     No current facility-administered medications for this visit.    Allergies as of 10/07/2014 - Review Complete 10/07/2014  Allergen Reaction Noted  . Penicillins Itching 01/23/2012  . Shrimp [shellfish allergy] Itching 01/23/2012  . Adhesive [tape] Rash 01/23/2012    No family history on file.  History   Social History  . Marital Status: Married    Spouse Name: N/A  . Number of Children: N/A  . Years of Education: N/A   Occupational History  . Not on file.   Social History Main Topics  . Smoking status: Never Smoker   . Smokeless tobacco: Not on file     Comment: Never smoked  . Alcohol Use: No  . Drug Use: No  . Sexual Activity: Yes   Other Topics Concern  . Not on file   Social History Narrative    Review of Systems: All negative except as per HPI.  Physical Exam: BP 99/70 mmHg  Pulse 69  Temp(Src) 97.5 F (36.4 C) (Oral)  Ht 5\' 6"  (1.676 m)  Wt 231 lb 3.2 oz (104.872 kg)  BMI 37.33 kg/m2 General:   Alert and oriented. Pleasant and cooperative. Well-nourished and well-developed.  Head:  Normocephalic and atraumatic. Eyes:  Without icterus, sclera clear and conjunctiva pink.  Ears:  Normal auditory acuity. Mouth:  No deformity or lesions, oral mucosa pink. No OP edema. Neck:  Supple, without mass or thyromegaly. Lungs:  Clear to auscultation bilaterally. No wheezes, rales, or rhonchi. No distress.  Heart:  S1, S2 present without murmurs appreciated.  Abdomen:   +BS, soft, and non-distended. Mild discomfort to abdomen with palpation. No HSM noted. No guarding or rebound. No masses appreciated.  Rectal:  Deferred  Msk:  Symmetrical without gross deformities. Normal posture. Pulses:  Normal pulses noted. Extremities:  Without clubbing or edema. Neurologic:  Alert and  oriented x4;  grossly normal neurologically. Skin:  Intact without significant lesions or rashes. Cervical Nodes:  No significant cervical adenopathy. Psych:  Alert and cooperative. Normal mood and affect.     10/07/2014 11:23 AM

## 2014-10-12 NOTE — Progress Notes (Signed)
CC'ED TO PCP 

## 2014-11-03 ENCOUNTER — Ambulatory Visit (INDEPENDENT_AMBULATORY_CARE_PROVIDER_SITE_OTHER): Payer: BLUE CROSS/BLUE SHIELD | Admitting: Gastroenterology

## 2014-11-03 ENCOUNTER — Other Ambulatory Visit: Payer: Self-pay

## 2014-11-03 ENCOUNTER — Encounter: Payer: Self-pay | Admitting: Gastroenterology

## 2014-11-03 VITALS — BP 109/74 | HR 90 | Temp 97.6°F | Ht 65.0 in | Wt 231.4 lb

## 2014-11-03 DIAGNOSIS — K5190 Ulcerative colitis, unspecified, without complications: Secondary | ICD-10-CM

## 2014-11-03 MED ORDER — NA SULFATE-K SULFATE-MG SULF 17.5-3.13-1.6 GM/177ML PO SOLN: 1.0000 | Freq: Once | ORAL | Status: DC

## 2014-11-03 MED ORDER — DICYCLOMINE HCL 10 MG PO CAPS: 10.0000 mg | ORAL_CAPSULE | Freq: Four times a day (QID) | ORAL | Status: DC | PRN

## 2014-11-03 NOTE — Patient Instructions (Addendum)
TAKE DICYCLOMINE 30 MINUTES PRIOR TO BREAKFAST AND LUNCH THREE DAYS A WEEK. IT MAY CAUSE DROWSINESS, DRY EYES/MOUTH, BLURRY VISION, OR DIFFICULTY URINATING.  USE PEPTO BISMOL AS NEEDED.  COMPLETE COLONOSCOPY WITH PROPOFOL.  FOLLOW UP IN 4 MOS.    Full Liquid Diet 1 day prior to colonoscopy A high-calorie, high-protein supplement should be used to meet your nutritional requirements when the full liquid diet is continued for more than 2 or 3 days. If this diet is to be used for an extended period of time (more than 7 days), a multivitamin should be considered.  Breads and Starches  Allowed: None are allowed except crackers WHOLE OR pureed (made into a thick, smooth soup) in soup.   Avoid: Any others.    Potatoes/Pasta/Rice  Allowed: ANY ITEM AS A SOUP OR SMALL PLATE OF MASHED POTATOES.       Vegetables  Allowed: Strained tomato or vegetable juice. Vegetables pureed in soup.   Avoid: Any others.    Fruit  Allowed: Any strained fruit juices and fruit drinks. Include 1 serving of citrus or vitamin C-enriched fruit juice daily.   Avoid: Any others.  Meat and Meat Substitutes  Allowed: Egg  Avoid: Any meat, fish, or fowl. All cheese.  Milk  Allowed: Milk beverages, including milk shakes and instant breakfast mixes. Smooth yogurt.   Avoid: Any others. Avoid dairy products if not tolerated.    Soups and Combination Foods  Allowed: Broth, strained cream soups. Strained, broth-based soups.   Avoid: Any others.    Desserts and Sweets  Allowed: flavored gelatin,plain ice cream, sherbet, smooth pudding, junket, fruit ices, frozen ice pops, pudding pops,, frozen fudge pops, chocolate syrup. Sugar, honey, jelly, syrup.   Avoid: Any others.  Fats and Oils  Allowed: Margarine, butter, cream, sour cream, oils.   Avoid: Any others.  Beverages  Allowed: All.   Avoid: None.  Condiments  Allowed: Iodized salt, pepper, spices, flavorings. Cocoa powder.   Avoid: Any  others.    SAMPLE MEAL PLAN Breakfast   cup orange juice.   1 OR 2 EGGS   1 cup  milk.   1 cup beverage (coffee or tea).   Cream or sugar, if desired.    Midmorning Snack  2 SCRAMBLED OR HARD BOILED EGG   Lunch  1 cup cream soup.    cup fruit juice.   1 cup milk.    cup custard.   1 cup beverage (coffee or tea).   Cream or sugar, if desired.    Midafternoon Snack  1 cup milk shake.  Dinner  1 cup cream soup.    cup fruit juice.   1 cup milk.    cup pudding.   1 cup beverage (coffee or tea).   Cream or sugar, if desired.  Evening Snack  1 cup supplement.  To increase calories, add sugar, cream, butter, or margarine if possible. Nutritional supplements will also increase the total calories.

## 2014-11-03 NOTE — Assessment & Plan Note (Addendum)
SYMPTOMS NOT IDEALLY CONTROLLED ON CANASA PRN AND ASACOL BID. LAST TCS 2 YRS AGO.  DISCUSSED MANAGEMENT OF ULCERATIVE COLITIS AND COLONOSCOPY. COLONOSCOPY WITH APR 12 W/ MAC DUE TO POLYPHARMACY. CONTINUE ASACOL AND PRN CANASA. TAKE DICYCLOMINE 10 MG TABLETS BEFORE BREAKFAST AND LUNCH 3 DAYS/WEEK.. MED SIDE EFFECTS DISCUSSED. FOLLOW UP IN 4 MOS.

## 2014-11-03 NOTE — Progress Notes (Signed)
Subjective:    Patient ID: Darlene Schwartz, female    DOB: 07-31-1966, 49 y.o.   MRN: [id number]  Zachery Dauer, MD PRIOR GI DOC: [facility]  HPI   Wants to know what she can do with ULCERATIVE COLITIS. TREATED BY RHEUMATOLOGY FOR JOINT PAIN. HAS FLARES WHEN SHE HAS TO STAY IN BED. QUIT TEACHING 3 YRS AGO. BMs: 1-2/DAY BUT CAN HAVE RECTAL URGENCY.  ASACOL BID BECAUSE SHE GETS HEADACHES. CANASA SUPP OFF AND ON. HAS DISTAL DISEASE. LAST TCS 2 YRS AGO. LAST TCS PROPOFOL. SAW BLOOD IN STOOL 1X-1 MO AGO. RARE FORMED STOOL: 1-2X IN PAST MO. WITH A FLARE SHE HAS BLOATING/BLADDER PRESSURE. NEXIUM HELPS FOR REFLUX(BURNING CHEST PAIN). NO WEIGHT LOSS. APPETITE: GOOD. NO SORES IN MOUTH, OR RASH ON LEGS. MAY HAVE SORE RECTUM.  PT DENIES FEVER, CHILLS, HEMATOCHEZIA, HEMATEMESIS, nausea, vomiting, melena, diarrhea, CHEST PAIN, SHORTNESS OF BREATH, CHANGE IN BOWEL IN HABITS, constipation, abdominal pain, problems swallowing, problems with sedation, OR indigestion.  Past Medical History  Diagnosis Date  . Ulcerative colitis   . Anxiety   . Pulmonary embolism   . Anxiety   . Panic attacks   . Fibromyalgia   . Chronic sinus infection   . Vertigo    Past Surgical History  Procedure Laterality Date  . Tonsillectomy    . Abdominal hysterectomy    . Nephrectomy-NONFUNCTIONING      right side-COMPLICATED BY PE   Allergies  Allergen Reactions  . Penicillins Itching  . Shrimp [Shellfish Allergy] Itching  . Adhesive [Tape] Rash    Current Outpatient Prescriptions  Medication Sig Dispense Refill  . acetaminophen (TYLENOL) 650 MG CR tablet Take 1,300 mg by mouth daily as needed for pain.     Marland Kitchen amitriptyline (ELAVIL) 50 MG tablet Take 50 mg by mouth at bedtime.    Marland Kitchen atenolol (TENORMIN) 25 MG tablet Take 50 mg by mouth 2 (two) times daily.     . citalopram (CELEXA) 40 MG tablet Take 20 mg by mouth at bedtime.     . dicyclomine (BENTYL) 10 MG capsule Take 10 mg by mouth daily as needed.    Marland Kitchen esomeprazole  (NEXIUM) 20 MG capsule Take 20 mg by mouth 2 (two) times daily before a meal.    . fluticasone (FLONASE) 50 MCG/ACT nasal spray Place 2 sprays into the nose daily.    Marland Kitchen loratadine (CLARITIN REDITABS) 10 MG dissolvable tablet Take 10 mg by mouth every morning.    . loratadine-pseudoephedrine (CLARITIN-D 24-HOUR) 10-240 MG per 24 hr tablet Take 1 tablet by mouth daily.    Marland Kitchen LORazepam (ATIVAN) 0.5 MG tablet Take 0.5 mg by mouth 3 (three) times daily as needed for anxiety.     . mesalamine (CANASA) 1000 MG suppository Place 1 suppository (1,000 mg total) rectally 2 (two) times daily. Take for 3 weeks PRN   . methylphenidate (RITALIN) 5 MG tablet Take 5 mg by mouth 2 (two) times daily.    . NON FORMULARY Asacol 800 mg    One tablet bid FOR YEARS   . NON FORMULARY Zana Flex  4 mg     One tablet qhs    . NON FORMULARY Cortizone injections tri monthly   In hip joints    . Pseudoephedrine-APAP-DM (DAYQUIL MULTI-SYMPTOM COLD/FLU PO) Take 2 capsules by mouth daily as needed (for symptoms).    . meclizine (ANTIVERT) 25 MG tablet Take one every 6 hours as needed for dizziness (Patient not taking: Reported on 11/03/2014)    .  predniSONE (DELTASONE) 10 MG tablet PRN OFF AND FOR COUPLE MOS FOR SINUS INFECTIONS 2.5 MG QD     Family History  Problem Relation Age of Onset  . Colon cancer Paternal Grandfather 35  . Ulcerative colitis Cousin   . Rheum arthritis Mother   . Rheum arthritis Paternal Grandmother   . Colon polyps Neg Hx     History   Social History  . Marital Status: Married    Spouse Name: N/A  . Number of Children: N/A  . Years of Education: N/A   Occupational History  . Not on file.   Social History Main Topics  . Smoking status: Never Smoker   . Smokeless tobacco: Never Used     Comment: Never smoked  . Alcohol Use: No  . Drug Use: No  . Sexual Activity: Yes   Other Topics Concern  . Not on file   Social History Narrative   ADOPTED TWIN BOYS: AGE 6. MARRIED-4 YRS(2ND  MARRIAGE). 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    . Review of Systems PER HPI OTHERWISE ALL SYSTEMS ARE NEGATIVE.     Objective:   Physical Exam  Constitutional: She is oriented to person, place, and time. She appears well-developed and well-nourished. No distress.  HENT:  Head: Normocephalic and atraumatic.  Mouth/Throat: Oropharynx is clear and moist. No oropharyngeal exudate.  Eyes: Pupils are equal, round, and reactive to light. No scleral icterus.  Neck: Normal range of motion. Neck supple.  Cardiovascular: Normal rate, regular rhythm and normal heart sounds.   Pulmonary/Chest: Effort normal and breath sounds normal. No respiratory distress.  Abdominal: Soft. Bowel sounds are normal. She exhibits no distension. There is no tenderness.  Musculoskeletal: She exhibits no edema.  Lymphadenopathy:    She has no cervical adenopathy.  Neurological: She is alert and oriented to person, place, and time.  NO FOCAL DEFICITS   Psychiatric:  SLIGHTLY ANXIOUS MOOD, NL AFFECT   Vitals reviewed.         Assessment & Plan:

## 2014-11-17 NOTE — Patient Instructions (Signed)
Darlene Schwartz  11/17/2014   Your procedure is scheduled on:   11/23/2014  Report to [facility] at  1000  AM.  Call this number if you have problems the morning of surgery: [phone]   Remember:   Do not eat food or drink liquids after midnight.   Take these medicines the morning of surgery with A SIP OF WATER: atenolol, celexa, bentyl, nexium,claritin, ativan,ritalin   Do not wear jewelry, make-up or nail polish.  Do not wear lotions, powders, or perfumes.   Do not shave 48 hours prior to surgery. Men may shave face and neck.  Do not bring valuables to the hospital.  [facility] is not responsible for any belongings or valuables.               Contacts, dentures or bridgework may not be worn into surgery.  Leave suitcase in the car. After surgery it may be brought to your room.  For patients admitted to the hospital, discharge time is determined by your treatment team.               Patients discharged the day of surgery will not be allowed to drive home.  Name and phone number of your driver: family  Special Instructions: N/A   Please read over the following fact sheets that you were given: Pain Booklet, Coughing and Deep Breathing, Surgical Site Infection Prevention, Anesthesia Post-op Instructions and Care and Recovery After Surgery Colonoscopy A colonoscopy is an exam to look at the entire large intestine (colon). This exam can help find problems such as tumors, polyps, inflammation, and areas of bleeding. The exam takes about 1 hour.  LET [facility] CARE PROVIDER KNOW ABOUT:   Any allergies you have.  All medicines you are taking, including vitamins, herbs, eye drops, creams, and over-the-counter medicines.  Previous problems you or members of your family have had with the use of anesthetics.  Any blood disorders you have.  Previous surgeries you have had.  Medical conditions you have. RISKS AND COMPLICATIONS  Generally, this is a safe procedure. However, as  with any procedure, complications can occur. Possible complications include:  Bleeding.  Tearing or rupture of the colon wall.  Reaction to medicines given during the exam.  Infection (rare). BEFORE THE PROCEDURE   Ask your health care provider about changing or stopping your regular medicines.  You may be prescribed an oral bowel prep. This involves drinking a large amount of medicated liquid, starting the day before your procedure. The liquid will cause you to have multiple loose stools until your stool is almost clear or light green. This cleans out your colon in preparation for the procedure.  Do not eat or drink anything else once you have started the bowel prep, unless your health care provider tells you it is safe to do so.  Arrange for someone to drive you home after the procedure. PROCEDURE   You will be given medicine to help you relax (sedative).  You will lie on your side with your knees bent.  A long, flexible tube with a light and camera on the end (colonoscope) will be inserted through the rectum and into the colon. The camera sends video back to a computer screen as it moves through the colon. The colonoscope also releases carbon dioxide gas to inflate the colon. This helps your health care provider see the area better.  During the exam, your health care provider may take a small  tissue sample (biopsy) to be examined under a microscope if any abnormalities are found.  The exam is finished when the entire colon has been viewed. AFTER THE PROCEDURE   Do not drive for 24 hours after the exam.  You may have a small amount of blood in your stool.  You may pass moderate amounts of gas and have mild abdominal cramping or bloating. This is caused by the gas used to inflate your colon during the exam.  Ask when your test results will be ready and how you will get your results. Make sure you get your test results. Document Released: 07/27/2000 Document Revised: 05/20/2013  Document Reviewed: 04/06/2013 [facility] Patient Information 2015 [place], [place]. This information is not intended to replace advice given to you by your health care provider. Make sure you discuss any questions you have with your health care provider. PATIENT INSTRUCTIONS POST-ANESTHESIA  IMMEDIATELY FOLLOWING SURGERY:  Do not drive or operate machinery for the first twenty four hours after surgery.  Do not make any important decisions for twenty four hours after surgery or while taking narcotic pain medications or sedatives.  If you develop intractable nausea and vomiting or a severe headache please notify your doctor immediately.  FOLLOW-UP:  Please make an appointment with your surgeon as instructed. You do not need to follow up with anesthesia unless specifically instructed to do so.  WOUND CARE INSTRUCTIONS (if applicable):  Keep a dry clean dressing on the anesthesia/puncture wound site if there is drainage.  Once the wound has quit draining you may leave it open to air.  Generally you should leave the bandage intact for twenty four hours unless there is drainage.  If the epidural site drains for more than 36-48 hours please call the anesthesia department.  QUESTIONS?:  Please feel free to call your physician or the hospital operator if you have any questions, and they will be happy to assist you.

## 2014-11-18 ENCOUNTER — Telehealth: Payer: Self-pay | Admitting: Gastroenterology

## 2014-11-18 ENCOUNTER — Other Ambulatory Visit: Payer: Self-pay

## 2014-11-18 MED ORDER — NA SULFATE-K SULFATE-MG SULF 17.5-3.13-1.6 GM/177ML PO SOLN: ORAL | Status: DC

## 2014-11-18 NOTE — Telephone Encounter (Signed)
Pt's husband called to say that their pharmacy does not have patient's prep prescription and we need to send it to [facility] on [address] in [place]. He said it was for Suprep. I told him that I would forward the message.

## 2014-11-18 NOTE — Telephone Encounter (Signed)
SENT ONLINE

## 2014-11-19 ENCOUNTER — Other Ambulatory Visit: Payer: Self-pay

## 2014-11-19 ENCOUNTER — Encounter (HOSPITAL_COMMUNITY)
Admission: RE | Admit: 2014-11-19 | Discharge: 2014-11-19 | Disposition: A | Discharge status: Home / Self Care | Payer: BLUE CROSS/BLUE SHIELD | Source: Ambulatory Visit | Attending: Gastroenterology | Admitting: Gastroenterology

## 2014-11-19 ENCOUNTER — Encounter (HOSPITAL_COMMUNITY): Payer: Self-pay

## 2014-11-19 DIAGNOSIS — Z01818 Encounter for other preprocedural examination: Secondary | ICD-10-CM | POA: Diagnosis not present

## 2014-11-19 HISTORY — DX: Chronic kidney disease, unspecified: N18.9

## 2014-11-19 HISTORY — DX: Essential (primary) hypertension: I10

## 2014-11-19 HISTORY — DX: Gastro-esophageal reflux disease without esophagitis: K21.9

## 2014-11-19 HISTORY — DX: Unspecified osteoarthritis, unspecified site: M19.90

## 2014-11-19 HISTORY — DX: Other specified behavioral and emotional disorders with onset usually occurring in childhood and adolescence: F98.8

## 2014-11-19 LAB — CBC
HCT: 41.4 % (ref 36.0–46.0)
Hemoglobin: 13.5 g/dL (ref 12.0–15.0)
MCH: 31.1 pg (ref 26.0–34.0)
MCHC: 32.6 g/dL (ref 30.0–36.0)
MCV: 95.4 fL (ref 78.0–100.0)
PLATELETS: 290 10*3/uL (ref 150–400)
RBC: 4.34 MIL/uL (ref 3.87–5.11)
RDW: 12.1 % (ref 11.5–15.5)
WBC: 9.6 10*3/uL (ref 4.0–10.5)

## 2014-11-19 LAB — BASIC METABOLIC PANEL
ANION GAP: 8 (ref 5–15)
BUN: 19 mg/dL (ref 6–23)
CALCIUM: 9.1 mg/dL (ref 8.4–10.5)
CO2: 27 mmol/L (ref 19–32)
CREATININE: 1.06 mg/dL (ref 0.50–1.10)
Chloride: 106 mmol/L (ref 96–112)
GFR calc non Af Amer: 61 mL/min — ABNORMAL LOW (ref >90)
GFR, EST AFRICAN AMERICAN: 71 mL/min — AB (ref >90)
Glucose, Bld: 123 mg/dL — ABNORMAL HIGH (ref 70–99)
Potassium: 4.7 mmol/L (ref 3.5–5.1)
SODIUM: 141 mmol/L (ref 135–145)

## 2014-11-19 NOTE — Pre-Procedure Instructions (Signed)
Patient given information to sign up for my chart at home. 

## 2014-11-23 ENCOUNTER — Ambulatory Visit (HOSPITAL_COMMUNITY)
Admission: RE | Admit: 2014-11-23 | Discharge: 2014-11-23 | Disposition: A | Discharge status: Home / Self Care | Payer: BLUE CROSS/BLUE SHIELD | Source: Ambulatory Visit | Attending: Gastroenterology | Admitting: Gastroenterology

## 2014-11-23 ENCOUNTER — Ambulatory Visit (HOSPITAL_COMMUNITY): Payer: BLUE CROSS/BLUE SHIELD | Admitting: Anesthesiology

## 2014-11-23 ENCOUNTER — Encounter (HOSPITAL_COMMUNITY): Payer: Self-pay | Admitting: *Deleted

## 2014-11-23 ENCOUNTER — Encounter (HOSPITAL_COMMUNITY)
Admission: RE | Disposition: A | Discharge status: Home / Self Care | Payer: Self-pay | Source: Ambulatory Visit | Attending: Gastroenterology

## 2014-11-23 DIAGNOSIS — K921 Melena: Secondary | ICD-10-CM | POA: Diagnosis not present

## 2014-11-23 DIAGNOSIS — Z86711 Personal history of pulmonary embolism: Secondary | ICD-10-CM | POA: Diagnosis not present

## 2014-11-23 DIAGNOSIS — Z90710 Acquired absence of both cervix and uterus: Secondary | ICD-10-CM | POA: Insufficient documentation

## 2014-11-23 DIAGNOSIS — K5190 Ulcerative colitis, unspecified, without complications: Secondary | ICD-10-CM | POA: Diagnosis not present

## 2014-11-23 DIAGNOSIS — K644 Residual hemorrhoidal skin tags: Secondary | ICD-10-CM | POA: Insufficient documentation

## 2014-11-23 DIAGNOSIS — K5120 Ulcerative (chronic) proctitis without complications: Secondary | ICD-10-CM | POA: Diagnosis present

## 2014-11-23 DIAGNOSIS — F410 Panic disorder [episodic paroxysmal anxiety] without agoraphobia: Secondary | ICD-10-CM | POA: Insufficient documentation

## 2014-11-23 DIAGNOSIS — Z79899 Other long term (current) drug therapy: Secondary | ICD-10-CM | POA: Insufficient documentation

## 2014-11-23 DIAGNOSIS — Z880 Allergy status to penicillin: Secondary | ICD-10-CM | POA: Diagnosis not present

## 2014-11-23 DIAGNOSIS — Z91013 Allergy to seafood: Secondary | ICD-10-CM | POA: Insufficient documentation

## 2014-11-23 DIAGNOSIS — K625 Hemorrhage of anus and rectum: Secondary | ICD-10-CM | POA: Diagnosis present

## 2014-11-23 DIAGNOSIS — M797 Fibromyalgia: Secondary | ICD-10-CM | POA: Diagnosis not present

## 2014-11-23 HISTORY — PX: COLONOSCOPY WITH PROPOFOL: SHX5780

## 2014-11-23 HISTORY — PX: BIOPSY: SHX5522

## 2014-11-23 SURGERY — COLONOSCOPY WITH PROPOFOL
Anesthesia: Monitor Anesthesia Care

## 2014-11-23 MED ORDER — LIDOCAINE HCL (PF) 1 % IJ SOLN
INTRAMUSCULAR | Status: AC
  Filled 2014-11-23: qty 5

## 2014-11-23 MED ORDER — FENTANYL CITRATE 0.05 MG/ML IJ SOLN
INTRAMUSCULAR | Status: AC
  Filled 2014-11-23: qty 2

## 2014-11-23 MED ORDER — ONDANSETRON HCL 4 MG/2ML IJ SOLN
4.0000 mg | Freq: Once | INTRAMUSCULAR | Status: AC
  Administered 2014-11-23: 4 mg via INTRAVENOUS

## 2014-11-23 MED ORDER — HYDROCORTISONE 100 MG/60ML RE ENEM: 100.0000 mg | ENEMA | Freq: Every day | RECTAL | Status: DC

## 2014-11-23 MED ORDER — FENTANYL CITRATE 0.05 MG/ML IJ SOLN
INTRAMUSCULAR | Status: DC | PRN
  Administered 2014-11-23 (×2): 50 ug via INTRAVENOUS

## 2014-11-23 MED ORDER — FENTANYL CITRATE 0.05 MG/ML IJ SOLN: 25.0000 ug | INTRAMUSCULAR | Status: DC | PRN

## 2014-11-23 MED ORDER — GLYCOPYRROLATE 0.2 MG/ML IJ SOLN
INTRAMUSCULAR | Status: AC
  Filled 2014-11-23: qty 1

## 2014-11-23 MED ORDER — ONDANSETRON HCL 4 MG/2ML IJ SOLN: 4.0000 mg | Freq: Once | INTRAMUSCULAR | Status: AC | PRN

## 2014-11-23 MED ORDER — PROPOFOL INFUSION 10 MG/ML OPTIME
INTRAVENOUS | Status: DC | PRN
  Administered 2014-11-23: 125 ug/kg/min via INTRAVENOUS
  Administered 2014-11-23: 11:00:00 via INTRAVENOUS

## 2014-11-23 MED ORDER — FENTANYL CITRATE 0.05 MG/ML IJ SOLN
25.0000 ug | INTRAMUSCULAR | Status: AC
  Administered 2014-11-23 (×2): 25 ug via INTRAVENOUS

## 2014-11-23 MED ORDER — LACTATED RINGERS IV SOLN
INTRAVENOUS | Status: DC
  Administered 2014-11-23: 1000 mL via INTRAVENOUS

## 2014-11-23 MED ORDER — ONDANSETRON HCL 4 MG/2ML IJ SOLN
INTRAMUSCULAR | Status: AC
  Filled 2014-11-23: qty 2

## 2014-11-23 MED ORDER — MIDAZOLAM HCL 5 MG/5ML IJ SOLN
INTRAMUSCULAR | Status: DC | PRN
  Administered 2014-11-23 (×2): 1 mg via INTRAVENOUS

## 2014-11-23 MED ORDER — MIDAZOLAM HCL 2 MG/2ML IJ SOLN
INTRAMUSCULAR | Status: AC
  Filled 2014-11-23: qty 2

## 2014-11-23 MED ORDER — STERILE WATER FOR IRRIGATION IR SOLN
Status: DC | PRN
  Administered 2014-11-23: 1000 mL

## 2014-11-23 MED ORDER — LIDOCAINE HCL (CARDIAC) 10 MG/ML IV SOLN
INTRAVENOUS | Status: DC | PRN
  Administered 2014-11-23: 25 mg via INTRAVENOUS

## 2014-11-23 MED ORDER — GLYCOPYRROLATE 0.2 MG/ML IJ SOLN
0.2000 mg | Freq: Once | INTRAMUSCULAR | Status: AC
  Administered 2014-11-23: 0.2 mg via INTRAVENOUS

## 2014-11-23 MED ORDER — LACTATED RINGERS IV SOLN
INTRAVENOUS | Status: DC | PRN
  Administered 2014-11-23: 10:00:00 via INTRAVENOUS

## 2014-11-23 MED ORDER — MIDAZOLAM HCL 2 MG/2ML IJ SOLN
1.0000 mg | INTRAMUSCULAR | Status: DC | PRN
  Administered 2014-11-23: 2 mg via INTRAVENOUS

## 2014-11-23 SURGICAL SUPPLY — 23 items
ELECT REM PT RETURN 9FT ADLT (ELECTROSURGICAL)
ELECTRODE REM PT RTRN 9FT ADLT (ELECTROSURGICAL) IMPLANT
FCP BXJMBJMB 240X2.8X (CUTTING FORCEPS)
FLOOR PAD 36X40 (MISCELLANEOUS) ×4
FORCEPS BIOP RAD 4 LRG CAP 4 (CUTTING FORCEPS) ×4 IMPLANT
FORCEPS BIOP RJ4 240 W/NDL (CUTTING FORCEPS)
FORCEPS BXJMBJMB 240X2.8X (CUTTING FORCEPS) IMPLANT
FORMALIN 10 PREFIL 20ML (MISCELLANEOUS) ×12 IMPLANT
INJECTOR/SNARE I SNARE (MISCELLANEOUS) IMPLANT
KIT CLEAN ENDO COMPLIANCE (KITS) ×4 IMPLANT
LUBRICANT JELLY 4.5OZ STERILE (MISCELLANEOUS) ×4 IMPLANT
MANIFOLD NEPTUNE II (INSTRUMENTS) ×4 IMPLANT
NEEDLE SCLEROTHERAPY 25GX240 (NEEDLE) IMPLANT
OVERTUBE ENDOCUFF GREEN (MISCELLANEOUS) ×4 IMPLANT
PAD FLOOR 36X40 (MISCELLANEOUS) ×2 IMPLANT
PROBE APC STR FIRE (PROBE) IMPLANT
PROBE INJECTION GOLD (MISCELLANEOUS)
PROBE INJECTION GOLD 7FR (MISCELLANEOUS) IMPLANT
SNARE SHORT THROW 13M SML OVAL (MISCELLANEOUS) IMPLANT
SYR 50ML LL SCALE MARK (SYRINGE) ×4 IMPLANT
TRAP SPECIMEN MUCOUS 40CC (MISCELLANEOUS) IMPLANT
TUBING IRRIGATION ENDOGATOR (MISCELLANEOUS) ×4 IMPLANT
WATER STERILE IRR 1000ML POUR (IV SOLUTION) ×4 IMPLANT

## 2014-11-23 NOTE — Anesthesia Postprocedure Evaluation (Signed)
  Anesthesia Post-op Note  Patient: Darlene Schwartz  Procedure(s) Performed: Procedure(s) with comments: COLONOSCOPY WITH PROPOFOL (N/A) - Cecum time in  1046  time out  1101 total time 15 mintues BIOPSY - Right Colon, Left Colon, Rectal  Patient Location: PACU  Anesthesia Type:MAC  Level of Consciousness: awake, alert , oriented and patient cooperative  Airway and Oxygen Therapy: Patient Spontanous Breathing and Patient connected to nasal cannula oxygen  Post-op Pain: none  Post-op Assessment: Post-op Vital signs reviewed, Patient's Cardiovascular Status Stable, Respiratory Function Stable, Patent Airway, No signs of Nausea or vomiting and Pain level controlled  Post-op Vital Signs: Reviewed and stable  Last Vitals:  Filed Vitals:   11/23/14 1020  BP: 103/65  Temp:   Resp: 22    Complications: No apparent anesthesia complications

## 2014-11-23 NOTE — Interval H&P Note (Signed)
History and Physical Interval Note:  11/23/2014 10:01 AM  Darlene Schwartz  has presented today for surgery, with the diagnosis of ulcerative coliits  The various methods of treatment have been discussed with the patient and family. After consideration of risks, benefits and other options for treatment, the patient has consented to  Procedure(s) with comments: COLONOSCOPY WITH PROPOFOL (N/A) - 1130-moved to 1015 Candy called pt as a surgical intervention .  The patient's history has been reviewed, patient examined, no change in status, stable for surgery.  I have reviewed the patient's chart and labs.  Questions were answered to the patient's satisfaction.     [employer]

## 2014-11-23 NOTE — Transfer of Care (Signed)
Immediate Anesthesia Transfer of Care Note  Patient: Darlene Schwartz  Procedure(s) Performed: Procedure(s) with comments: COLONOSCOPY WITH PROPOFOL (N/A) - Cecum time in  1046  time out  1101 total time 15 mintues BIOPSY - Right Colon, Left Colon, Rectal  Patient Location: PACU  Anesthesia Type:MAC  Level of Consciousness: awake, alert , oriented and patient cooperative  Airway & Oxygen Therapy: Patient Spontanous Breathing and Patient connected to nasal cannula oxygen  Post-op Assessment: Report given to RN and Post -op Vital signs reviewed and stable  Post vital signs: Reviewed and stable  Last Vitals:  Filed Vitals:   11/23/14 1020  BP: 103/65  Temp:   Resp: 22    Complications: No apparent anesthesia complications

## 2014-11-23 NOTE — Discharge Instructions (Signed)
You have MODERATELY ACTIVE PROCTITIS. YOUR COLON AND SMALL BOWEL ARE NORMAL. I BIOPSIED YOUR COLON AND RECTUM.   FOLLOW A HIGH FIBER DIET. AVOID ITEMS THAT CAUSE BLOATING & GAS. SEE INFO BELOW.  STOP ASACOL. CONTINUE CANASA SUPPOSITORIES TWICE DAILY.  START HYDROCORTISONE ENEMA AT BEDTIME FOR 21 DAYS.  TAKE DICYCLOMINE 10 MG TABLETS BEFORE BREAKFAST AND LUNCH 3 DAYS/WEEK. Hold for constipation.  AVOID USING ASPIRIN, BC/GOODY POWDERS,  OR IBUPROFEN, MOTRIN, ALEVE, OR NAPROXEN.   USE TYLENOL  AS NEEDED FOR ABDOMINAL PAIN.   TAKE A PROBIOTIC DAILY FOR TWO MONTHS ([facility], [facility], OR [facility]).   FOLLOW UP IN JUL 2016.  Colonoscopy Care After Read the instructions outlined below and refer to this sheet in the next week. These discharge instructions provide you with general information on caring for yourself after you leave the hospital. While your treatment has been planned according to the most current medical practices available, unavoidable complications occasionally occur. If you have any problems or questions after discharge, call DR. FIELDS, [phone].  ACTIVITY  You may resume your regular activity, but move at a slower pace for the next 24 hours.   Take frequent rest periods for the next 24 hours.   Walking will help get rid of the air and reduce the bloated feeling in your belly (abdomen).   No driving for 24 hours (because of the medicine (anesthesia) used during the test).   You may shower.   Do not sign any important legal documents or operate any machinery for 24 hours (because of the anesthesia used during the test).    NUTRITION  Drink plenty of fluids.   You may resume your normal diet as instructed by your doctor.   Begin with a light meal and progress to your normal diet. Heavy or fried foods are harder to digest and may make you feel sick to your stomach (nauseated).   Avoid alcoholic beverages for 24 hours or as instructed.     MEDICATIONS  You may resume your normal medications.   WHAT YOU CAN EXPECT TODAY  Some feelings of bloating in the abdomen.   Passage of more gas than usual.   Spotting of blood in your stool or on the toilet paper  .  IF YOU HAD POLYPS REMOVED DURING THE COLONOSCOPY:  No aspirin products for 7 days or as instructed.   Eat a soft diet IF YOU HAVE NAUSEA, BLOATING, ABDOMINAL PAIN, OR VOMITING.    FINDING OUT THE RESULTS OF YOUR TEST Not all test results are available during your visit. DR. Darrick Penna WILL CALL YOU WITHIN 14 DAYS OF YOUR PROCEDUE WITH YOUR RESULTS. Do not assume everything is normal if you have not heard from DR. FIELDS, CALL HER OFFICE AT [phone].  SEEK IMMEDIATE MEDICAL ATTENTION AND CALL THE OFFICE: [phone] IF:  You have more than a spotting of blood in your stool.   Your belly is swollen (abdominal distention).   You are nauseated or vomiting.   You have a temperature over 101F.   You have abdominal pain or discomfort that is severe or gets worse throughout the day.   PROCTITIS Proctitis is an inflammation of the lining of the rectum. The rectum is a muscular tube that's connected to the end of your colon. Stool passes through the rectum on its way out of the body. Proctitis can cause rectal pain and the continuous sensation that you need to have a bowel movement. Proctitis symptoms can be short-lived, or they  can become chronic.  Proctitis signs and symptoms may include: A frequent or continuous feeling that you need to have a bowel movement  Rectal bleeding  The passing of mucus through your rectum  Rectal pain  Pain on the left side of your abdomen  A feeling of fullness in your rectum  Diarrhea Pain with bowel movements High-Fiber Diet Fiber is found in fruits, vegetables, and grains. A high-fiber diet encourages the addition of more whole grains, legumes, fruits, and vegetables in your diet. The recommended amount of fiber for  adult males is 38 g per day. For adult females, it is 25 g per day. Pregnant and lactating women should get 28 g of fiber per day. If you have a digestive or bowel problem, ask your caregiver for advice before adding high-fiber foods to your diet. Eat a variety of high-fiber foods instead of only a select few type of foods.  PURPOSE To increase stool bulk. To make bowel movements more regular to prevent constipation. To lower cholesterol. To prevent overeating. WHEN IS THIS DIET USED? It may be used if you have constipation and hemorrhoids. It may be used if you have uncomplicated diverticulosis (intestine condition) and irritable bowel syndrome. It may be used if you need help with weight management. It may be used if you want to add it to your diet as a protective measure against atherosclerosis, diabetes, and cancer. SOURCES OF FIBER Whole-grain breads and cereals. Fruits, such as apples, oranges, bananas, berries, prunes, and pears. Vegetables, such as green peas, carrots, sweet potatoes, beets, broccoli, cabbage, spinach, and artichokes. Legumes, such split peas, soy, lentils. Almonds. FIBER CONTENT IN FOODS Starches and Grains / Dietary Fiber (g) Cheerios, 1 cup / 3 g Corn Flakes cereal, 1 cup / 0.7 g Rice crispy treat cereal, 1 cup / 0.3 g Instant oatmeal (cooked),  cup / 2 g Frosted wheat cereal, 1 cup / 5.1 g Brown, long-grain rice (cooked), 1 cup / 3.5 g White, long-grain rice (cooked), 1 cup / 0.6 g Enriched macaroni (cooked), 1 cup / 2.5 g Legumes / Dietary Fiber (g) Baked beans (canned, plain, or vegetarian),  cup / 5.2 g Kidney beans (canned),  cup / 6.8 g Pinto beans (cooked),  cup / 5.5 g Breads and Crackers / Dietary Fiber (g) Plain or honey graham crackers, 2 squares / 0.7 g Saltine crackers, 3 squares / 0.3 g Plain, salted pretzels, 10 pieces / 1.8 g Whole-wheat bread, 1 slice / 1.9 g White bread, 1 slice / 0.7 g Raisin bread, 1 slice / 1.2 g Plain bagel,  3 oz / 2 g Flour tortilla, 1 oz / 0.9 g Corn tortilla, 1 small / 1.5 g Hamburger or hotdog bun, 1 small / 0.9 g Fruits / Dietary Fiber (g) Apple with skin, 1 medium / 4.4 g Sweetened applesauce,  cup / 1.5 g Banana,  medium / 1.5 g Grapes, 10 grapes / 0.4 g Orange, 1 small / 2.3 g Raisin, 1.5 oz / 1.6 g Melon, 1 cup / 1.4 g Vegetables / Dietary Fiber (g) Green beans (canned),  cup / 1.3 g Carrots (cooked),  cup / 2.3 g Broccoli (cooked),  cup / 2.8 g Peas (cooked),  cup / 4.4 g Mashed potatoes,  cup / 1.6 g Lettuce, 1 cup / 0.5 g Corn (canned),  cup / 1.6 g Tomato,  cup / 1.1 g Document Released: 07/30/2005 Document Revised: 01/29/2012 Document Reviewed: 11/01/2011 [employer] Patient Information 2015 [place], [place]. This information is not  intended to replace advice given to you by your health care provider. Make sure you discuss any questions you have with your health care provider.

## 2014-11-23 NOTE — Anesthesia Procedure Notes (Signed)
Procedure Name: MAC Date/Time: 11/23/2014 10:26 AM Performed by: Pernell Dupre, Wilton Thrall A Pre-anesthesia Checklist: Patient identified, Timeout performed, Emergency Drugs available, Suction available and Patient being monitored Oxygen Delivery Method: Simple face mask

## 2014-11-23 NOTE — Anesthesia Preprocedure Evaluation (Signed)
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 on medications and Pt. on home beta blockers + dysrhythmias (beta blocker) Rhythm:Regular Rate:Normal     Neuro/Psych PSYCHIATRIC DISORDERS Anxiety    GI/Hepatic PUD, GERD-  Controlled and Medicated,  Endo/Other    Renal/GU Renal disease     Musculoskeletal  (+) Fibromyalgia -  Abdominal   Peds  Hematology   Anesthesia Other Findings   Reproductive/Obstetrics                             Anesthesia Physical Anesthesia Plan  ASA: III  Anesthesia Plan: MAC   Post-op Pain Management:    Induction: Intravenous  Airway Management Planned: Simple Face Mask  Additional Equipment:   Intra-op Plan:   Post-operative Plan:   Informed Consent: I have reviewed the patients History and Physical, chart, labs and discussed the procedure including the risks, benefits and alternatives for the proposed anesthesia with the patient or authorized representative who has indicated his/her understanding and acceptance.     Plan Discussed with:   Anesthesia Plan Comments:         Anesthesia Quick Evaluation

## 2014-11-23 NOTE — Op Note (Signed)
[facility] [address] [place], [address]   COLONOSCOPY PROCEDURE REPORT  PATIENT: Darlene, Schwartz  MR#: [id number] BIRTHDATE: 1965/10/07 , 48  yrs. old GENDER: female ENDOSCOPIST: West Bali, MD REFERRED TS:[id number] Milam, M.D. PROCEDURE DATE:  12/03/2014 PROCEDURE:   Colonoscopy with biopsy INDICATIONS:ULCERATIVE PROCTITIS SINCE AGE 49.  OCCASIONAL RECTAL BLEEDING, & SHOOTING RECTAL PAIN.Marland Kitchen MEDICATIONS: Monitored anesthesia care  DESCRIPTION OF PROCEDURE:    Physical exam was performed.  Informed consent was obtained from the patient after explaining the benefits, risks, and alternatives to procedure.  The patient was connected to monitor and placed in left lateral position. Continuous oxygen was provided by nasal cannula and IV medicine administered through an indwelling cannula.  After administration of sedation and rectal exam, the patients rectum was intubated and the     colonoscope was advanced under direct visualization to the ileum.  The scope was removed slowly by carefully examining the color, texture, anatomy, and integrity mucosa on the way out.  The patient was recovered in endoscopy and discharged home in satisfactory condition.    COLON FINDINGS: The examined terminal ileum appeared to be normal. , The colon was redundant.  Manual abdominal counter-pressure was used to reach the cecum, The colonic mucosa appeared normal throughout the entire examined colon.  Multiple biopsies were performed IN THE RIGHT & LEFT COLON. ERYTHEMA AND EDEMA IN THE RECTUM WITHOUT ULCERATION STARTING AT ANAL VERGE AND EXTENDING TO 18-19 CMS.  RANDOM COLD FORCEPS BIOPSIES OBTAINED.    MODERATE TO LARGE  EXTERNAL HEMORRHOIDS.  PREP QUALITY: excellent.  CECAL W/D TIME: 15        minutes COMPLICATIONS: None  ENDOSCOPIC IMPRESSION: 1.   NORMAL ILEUM 2.   The LEFT colon IS redundant 3.   MODERATE PROCTITIS  RECOMMENDATIONS: FOLLOW A HIGH FIBER DIET. STOP ASACOL.   CONTINUE CANASA SUPPOSITORIES TWICE DAILY. USE HCT ENEMA QHS FOR 3 WEEKS. TAKE DICYCLOMINE 10 MG TABLETS BEFORE BREAKFAST AND LUNCH 3 DAYS/WEEK. AVOID USING ASPIRIN, BC/GOODY POWDERS, OR IBUPROFEN, MOTRIN, ALEVE, OR NAPROXEN.  USE TYLENOL AS NEEDED FOR ABDOMINAL PAIN. TAKE A PROBIOTIC DAILY FOR TWO MONTHS. SEE DR.  Lovell Sheehan TO EVALUATE FOR HEMORRHOIDECTOMY IN ONE MONTH. FOLLOW UP IN JUL 2016.   eSigned:  West Bali, MD 12/03/14 11:36 AM   CPT CODES: ICD CODES:  The ICD and CPT codes recommended by this software are interpretations from the data that the clinical staff has captured with the software.  The verification of the translation of this report to the ICD and CPT codes and modifiers is the sole responsibility of the health care institution and practicing physician where this report was generated.  [employer] will not be held responsible for the validity of the ICD and CPT codes included on this report.  AMA assumes no liability for data contained or not contained herein. CPT is a Publishing rights manager of the [employer].

## 2014-11-23 NOTE — H&P (View-Only) (Signed)
Subjective:    Patient ID: Darlene Schwartz, female    DOB: 07-31-1966, 49 y.o.   MRN: [id number]  Zachery Dauer, MD PRIOR GI DOC: PANDYA  HPI   Wants to know what she can do with ULCERATIVE COLITIS. TREATED BY RHEUMATOLOGY FOR JOINT PAIN. HAS FLARES WHEN SHE HAS TO STAY IN BED. QUIT TEACHING 3 YRS AGO. BMs: 1-2/DAY BUT CAN HAVE RECTAL URGENCY.  ASACOL BID BECAUSE SHE GETS HEADACHES. CANASA SUPP OFF AND ON. HAS DISTAL DISEASE. LAST TCS 2 YRS AGO. LAST TCS PROPOFOL. SAW BLOOD IN STOOL 1X-1 MO AGO. RARE FORMED STOOL: 1-2X IN PAST MO. WITH A FLARE SHE HAS BLOATING/BLADDER PRESSURE. NEXIUM HELPS FOR REFLUX(BURNING CHEST PAIN). NO WEIGHT LOSS. APPETITE: GOOD. NO SORES IN MOUTH, OR RASH ON LEGS. MAY HAVE SORE RECTUM.  PT DENIES FEVER, CHILLS, HEMATOCHEZIA, HEMATEMESIS, nausea, vomiting, melena, diarrhea, CHEST PAIN, SHORTNESS OF BREATH, CHANGE IN BOWEL IN HABITS, constipation, abdominal pain, problems swallowing, problems with sedation, OR indigestion.  Past Medical History  Diagnosis Date  . Ulcerative colitis   . Anxiety   . Pulmonary embolism   . Anxiety   . Panic attacks   . Fibromyalgia   . Chronic sinus infection   . Vertigo    Past Surgical History  Procedure Laterality Date  . Tonsillectomy    . Abdominal hysterectomy    . Nephrectomy-NONFUNCTIONING      right side-COMPLICATED BY PE   Allergies  Allergen Reactions  . Penicillins Itching  . Shrimp [Shellfish Allergy] Itching  . Adhesive [Tape] Rash    Current Outpatient Prescriptions  Medication Sig Dispense Refill  . acetaminophen (TYLENOL) 650 MG CR tablet Take 1,300 mg by mouth daily as needed for pain.     Marland Kitchen amitriptyline (ELAVIL) 50 MG tablet Take 50 mg by mouth at bedtime.    Marland Kitchen atenolol (TENORMIN) 25 MG tablet Take 50 mg by mouth 2 (two) times daily.     . citalopram (CELEXA) 40 MG tablet Take 20 mg by mouth at bedtime.     . dicyclomine (BENTYL) 10 MG capsule Take 10 mg by mouth daily as needed.    Marland Kitchen esomeprazole  (NEXIUM) 20 MG capsule Take 20 mg by mouth 2 (two) times daily before a meal.    . fluticasone (FLONASE) 50 MCG/ACT nasal spray Place 2 sprays into the nose daily.    Marland Kitchen loratadine (CLARITIN REDITABS) 10 MG dissolvable tablet Take 10 mg by mouth every morning.    . loratadine-pseudoephedrine (CLARITIN-D 24-HOUR) 10-240 MG per 24 hr tablet Take 1 tablet by mouth daily.    Marland Kitchen LORazepam (ATIVAN) 0.5 MG tablet Take 0.5 mg by mouth 3 (three) times daily as needed for anxiety.     . mesalamine (CANASA) 1000 MG suppository Place 1 suppository (1,000 mg total) rectally 2 (two) times daily. Take for 3 weeks PRN   . methylphenidate (RITALIN) 5 MG tablet Take 5 mg by mouth 2 (two) times daily.    . NON FORMULARY Asacol 800 mg    One tablet bid FOR YEARS   . NON FORMULARY Zana Flex  4 mg     One tablet qhs    . NON FORMULARY Cortizone injections tri monthly   In hip joints    . Pseudoephedrine-APAP-DM (DAYQUIL MULTI-SYMPTOM COLD/FLU PO) Take 2 capsules by mouth daily as needed (for symptoms).    . meclizine (ANTIVERT) 25 MG tablet Take one every 6 hours as needed for dizziness (Patient not taking: Reported on 11/03/2014)    .  predniSONE (DELTASONE) 10 MG tablet PRN OFF AND FOR COUPLE MOS FOR SINUS INFECTIONS 2.5 MG QD     Family History  Problem Relation Age of Onset  . Colon cancer Paternal Grandfather 60  . Ulcerative colitis Cousin   . Rheum arthritis Mother   . Rheum arthritis Paternal Grandmother   . Colon polyps Neg Hx     History   Social History  . Marital Status: Married    Spouse Name: N/A  . Number of Children: N/A  . Years of Education: N/A   Occupational History  . Not on file.   Social History Main Topics  . Smoking status: Never Smoker   . Smokeless tobacco: Never Used     Comment: Never smoked  . Alcohol Use: No  . Drug Use: No  . Sexual Activity: Yes   Other Topics Concern  . Not on file   Social History Narrative   ADOPTED TWIN BOYS: AGE 22. MARRIED-4 YRS(2ND  MARRIAGE). 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    . Review of Systems PER HPI OTHERWISE ALL SYSTEMS ARE NEGATIVE.     Objective:   Physical Exam  Constitutional: She is oriented to person, place, and time. She appears well-developed and well-nourished. No distress.  HENT:  Head: Normocephalic and atraumatic.  Mouth/Throat: Oropharynx is clear and moist. No oropharyngeal exudate.  Eyes: Pupils are equal, round, and reactive to light. No scleral icterus.  Neck: Normal range of motion. Neck supple.  Cardiovascular: Normal rate, regular rhythm and normal heart sounds.   Pulmonary/Chest: Effort normal and breath sounds normal. No respiratory distress.  Abdominal: Soft. Bowel sounds are normal. She exhibits no distension. There is no tenderness.  Musculoskeletal: She exhibits no edema.  Lymphadenopathy:    She has no cervical adenopathy.  Neurological: She is alert and oriented to person, place, and time.  NO FOCAL DEFICITS   Psychiatric:  SLIGHTLY ANXIOUS MOOD, NL AFFECT   Vitals reviewed.         Assessment & Plan:   

## 2014-11-24 ENCOUNTER — Encounter (HOSPITAL_COMMUNITY): Payer: Self-pay | Admitting: Gastroenterology

## 2014-11-30 ENCOUNTER — Telehealth: Payer: Self-pay | Admitting: Gastroenterology

## 2014-11-30 ENCOUNTER — Other Ambulatory Visit: Payer: Self-pay

## 2014-11-30 DIAGNOSIS — K649 Unspecified hemorrhoids: Secondary | ICD-10-CM

## 2014-11-30 NOTE — Telephone Encounter (Signed)
OV MADE °

## 2014-11-30 NOTE — Telephone Encounter (Signed)
Pt is aware of results and OK to refer to Dr. Lovell Sheehan.

## 2014-11-30 NOTE — Telephone Encounter (Signed)
Please call pt. Her right and left colon biopsies are normal. SHE HAS INFLAMMATION THAT IS MILD AND LIMITED TO HER RECTUM. HER LOOSE STOOLS MAY BE DUE TO IBS, OR SMALL BOWEL BACTERIAL OVERGROWTH(SBBO).   FOLLOW A HIGH FIBER DIET. AVOID ITEMS THAT CAUSE BLOATING & GAS.   CONTINUE CANASA SUPPOSITORIES TWICE DAILY.  COMPLETE HYDROCORTISONE ENEMA AT BEDTIME FOR 21 DAYS.  TAKE DICYCLOMINE 10 MG TABLETS BEFORE BREAKFAST AND LUNCH 3 DAYS/WEEK. Hold for constipation.  AVOID USING ASPIRIN, BC/GOODY POWDERS,  OR IBUPROFEN, MOTRIN, ALEVE, OR NAPROXEN.   USE TYLENOL  AS NEEDED FOR ABDOMINAL PAIN.   TAKE A PROBIOTIC DAILY FOR TWO MONTHS UNIL JUN 20.  SEE DR. Lovell Sheehan IN MAY 2016 TO EVALUATE FOR HEMORRHOIDECTOMY.  FOLLOW UP IN JUL 2016 E30 HEMORRHOIDS/ULCERATIVE PROCTITIS.

## 2014-12-01 NOTE — Telephone Encounter (Signed)
Spoke with pt she is aware of appointment °

## 2014-12-01 NOTE — Telephone Encounter (Signed)
Tried calling all numbers on pt demographics. No answer. LMOM for pt to call us back

## 2014-12-01 NOTE — Telephone Encounter (Signed)
Pt has appointment with Dr. Lovell Sheehan on 12/23/2014 @ 9:45am

## 2014-12-24 ENCOUNTER — Encounter: Payer: Self-pay | Admitting: Gastroenterology

## 2015-02-22 ENCOUNTER — Ambulatory Visit: Payer: BLUE CROSS/BLUE SHIELD | Admitting: Gastroenterology

## 2015-03-11 ENCOUNTER — Ambulatory Visit (INDEPENDENT_AMBULATORY_CARE_PROVIDER_SITE_OTHER): Payer: BLUE CROSS/BLUE SHIELD | Admitting: Gastroenterology

## 2015-03-11 ENCOUNTER — Encounter: Payer: Self-pay | Admitting: Gastroenterology

## 2015-03-11 VITALS — BP 116/83 | HR 70 | Temp 97.6°F | Ht 66.0 in | Wt 242.8 lb

## 2015-03-11 DIAGNOSIS — K51911 Ulcerative colitis, unspecified with rectal bleeding: Secondary | ICD-10-CM | POA: Diagnosis not present

## 2015-03-11 MED ORDER — MESALAMINE 1000 MG RE SUPP: 1000.0000 mg | Freq: Every day | RECTAL | Status: DC

## 2015-03-11 NOTE — Patient Instructions (Signed)
1. Complete hydrocortisone enemas if possible. 2. Start back on Canasa suppositories once daily at bedtime.  3. Call if you diarrhea does not improve. 4. Return to the office in 6 months to see Dr. Darrick Penna.

## 2015-03-11 NOTE — Assessment & Plan Note (Signed)
No rectal bleeding. Multiple stools daily. Favor symptoms related to IBS rather than active UC given endoscopic findings in April and lack of rectal bleeding/tenemus. Patient has been off therapy for few months by mistake.  Resume Canasa suppositories 1000 mg at bedtime. Starting once daily as opposed to recommended twice a day per Dr. Darrick Penna per patient's request. Patient will call a couple weeks and let me know how her diarrhea is doing.  Continue Bentyl as before.   Consider interval flex sig every 1-2 years and next TCS in 5-10 years per Dr. Darrick Penna.  Return to the office in six months to see Dr. Darrick Penna.

## 2015-03-11 NOTE — Progress Notes (Signed)
Primary Care Physician: Zachery Dauer, MD  Primary Gastroenterologist:  Jonette Eva, MD   Chief Complaint  Patient presents with  . Follow-up    HPI: Darlene Schwartz is a 49 y.o. 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 Erythema and edema in the rectum without ulceration starting at the anal verge extending to 18 and 19 cm. Moderate to large external hemorrhoids. Normal ileum. Patient recommended to stop Asacol, continue Canasa suppositories twice a day. Hydrocortisone enemas started for 3 weeks. Added dicyclomine. Sent to Dr. Lovell Sheehan for evaluation for hemorrhoidectomy. Right and left colon biopsies benign. Rectal biopsy showed quiesced and chronic colitis.  Asacol for over 20 years. Really hasn't noticed much change off medication. Used five cortenemas only, too much cramping. Has been out of Canasa for couple of months. States she didn't realize she should still be on it. Cancelled appointment with Dr. Lovell Sheehan because her mom has been ill. Not interested in surgery for hemorrhoids.   Rare brbpr. BMs loose sometimes worse than others. Usually less than 5 per day. No nocturnal symptoms. Bentyl 2-3 times per day, helps some.   Rhematoid arthritis. Has avoided Humira and other regimens due to history of only one kidney.     Current Outpatient Prescriptions  Medication Sig Dispense Refill  . acetaminophen (TYLENOL) 650 MG CR tablet Take 1,300 mg by mouth daily as needed for pain.     Marland Kitchen amitriptyline (ELAVIL) 50 MG tablet Take 50 mg by mouth at bedtime.    Marland Kitchen atenolol (TENORMIN) 25 MG tablet Take 50 mg by mouth 2 (two) times daily.     . citalopram (CELEXA) 40 MG tablet Take 20 mg by mouth at bedtime.     . dicyclomine (BENTYL) 10 MG capsule Take 1 capsule (10 mg total) by mouth 4 (four) times daily as needed. 120 capsule 5  . esomeprazole (NEXIUM) 20 MG capsule Take 20 mg by mouth 2 (two) times daily before a  meal.    . fluticasone (FLONASE) 50 MCG/ACT nasal spray Place 2 sprays into the nose daily.    Marland Kitchen loratadine (CLARITIN REDITABS) 10 MG dissolvable tablet Take 10 mg by mouth every morning.    Marland Kitchen LORazepam (ATIVAN) 0.5 MG tablet Take 0.5 mg by mouth 3 (three) times daily as needed for anxiety.     . methylphenidate (RITALIN) 5 MG tablet Take 5 mg by mouth 2 (two) times daily.    . NON FORMULARY Cortizone injections tri monthly   In hip joints    . tiZANidine (ZANAFLEX) 4 MG tablet Take 4 mg by mouth at bedtime.    . mesalamine (CANASA) 1000 MG suppository Place 1 suppository (1,000 mg total) rectally 2 (two) times daily. Take for 3 weeks (Patient not taking: Reported on 03/11/2015) 42 suppository 0   No current facility-administered medications for this visit.    Allergies as of 03/11/2015 - Review Complete 03/11/2015  Allergen Reaction Noted  . Penicillins Itching 01/23/2012  . Adhesive [tape] Rash 01/23/2012    ROS:  General: Negative for anorexia, weight loss, fever, chills, fatigue, weakness. ENT: Negative for hoarseness, difficulty swallowing , nasal congestion. CV: Negative for chest pain, angina, palpitations, dyspnea on exertion, peripheral edema.  Respiratory: Negative for dyspnea at rest, dyspnea on exertion, cough, sputum, wheezing.  GI: See history of present illness. GU:  Negative for dysuria, hematuria, urinary incontinence, urinary frequency, nocturnal urination.  Endo: Negative for unusual weight change.  Physical Examination:   BP 116/83 mmHg  Pulse 70  Temp(Src) 97.6 F (36.4 C) (Oral)  Ht 5\' 6"  (1.676 m)  Wt 242 lb 12.8 oz (110.133 kg)  BMI 39.21 kg/m2  General: Well-nourished, well-developed in no acute distress.  Eyes: No icterus. Mouth: Oropharyngeal mucosa moist and pink , no lesions erythema or exudate. Lungs: Clear to auscultation bilaterally.  Heart: Regular rate and rhythm, no murmurs rubs or gallops.  Abdomen: Bowel sounds are normal, nontender,  nondistended, no hepatosplenomegaly or masses, no abdominal bruits or hernia , no rebound or guarding.   Extremities: No lower extremity edema. No clubbing or deformities. Neuro: Alert and oriented x 4   Skin: Warm and dry, no jaundice.   Psych: Alert and cooperative, normal mood and affect.  Labs:  Lab Results  Component Value Date   CREATININE 1.06 11/19/2014   BUN 19 11/19/2014   NA 141 11/19/2014   K 4.7 11/19/2014   CL 106 11/19/2014   CO2 27 11/19/2014   Lab Results  Component Value Date   WBC 9.6 11/19/2014   HGB 13.5 11/19/2014   HCT 41.4 11/19/2014   MCV 95.4 11/19/2014   PLT 290 11/19/2014   Patient reports normal LFTs done through rheumatologist.   Imaging Studies: No results found.

## 2015-03-14 NOTE — Progress Notes (Signed)
cc'ed to pcp °

## 2015-05-23 ENCOUNTER — Other Ambulatory Visit: Payer: Self-pay | Admitting: Gastroenterology

## 2015-05-31 ENCOUNTER — Other Ambulatory Visit: Payer: Self-pay

## 2015-05-31 DIAGNOSIS — K51911 Ulcerative colitis, unspecified with rectal bleeding: Secondary | ICD-10-CM

## 2015-05-31 MED ORDER — MESALAMINE 1000 MG RE SUPP: 1000.0000 mg | Freq: Every day | RECTAL | Status: DC

## 2015-06-22 ENCOUNTER — Telehealth: Payer: Self-pay | Admitting: Gastroenterology

## 2015-06-22 NOTE — Telephone Encounter (Signed)
Pt called to make OV the same day as her mother's and is aware of both appointments. She also said her pharmacy has changed to [facility] in [place] and needs another Asacol prescription called in.

## 2015-06-23 NOTE — Telephone Encounter (Signed)
See OV note of 11/03/2014 of Dr. Darrick Penna, she told pt to continue Asacol but I do not see strength. Routing to Dr. Darrick Penna to address.

## 2015-06-23 NOTE — Telephone Encounter (Signed)
LMOM for a return call from pt. I do not see Asacol on her med list.

## 2015-06-23 NOTE — Telephone Encounter (Signed)
Per Darlene Schwartz, pt called and said she is on Asacol 800 HD.

## 2015-06-27 NOTE — Telephone Encounter (Signed)
LMOM for a return call.  

## 2015-06-27 NOTE — Telephone Encounter (Addendum)
PLEASE CALL PT. DOES SHE NEED ASACOL OR CANASA. I HAVE NEVER RX ASACOL HD. HER LAST 2 VISIT SHE WAS ON CANASA SUPP FOR MILD PROCTITIS. THE LAST TIME SHE WAS NOTED TO BE ON ASACOL HD WAS 2014. IN MAR 2016 SHE WAS NOTED TO BE TAKING ASACOL HD BID.

## 2015-06-28 ENCOUNTER — Ambulatory Visit (INDEPENDENT_AMBULATORY_CARE_PROVIDER_SITE_OTHER): Payer: BLUE CROSS/BLUE SHIELD | Admitting: Gastroenterology

## 2015-06-28 ENCOUNTER — Encounter: Payer: Self-pay | Admitting: Gastroenterology

## 2015-06-28 VITALS — BP 113/75 | HR 77 | Temp 97.6°F | Ht 66.0 in | Wt 240.4 lb

## 2015-06-28 DIAGNOSIS — K5120 Ulcerative (chronic) proctitis without complications: Secondary | ICD-10-CM | POA: Diagnosis not present

## 2015-06-28 MED ORDER — MESALAMINE 1.2 G PO TBEC: 4.8000 g | DELAYED_RELEASE_TABLET | Freq: Every day | ORAL | Status: DC

## 2015-06-28 NOTE — Progress Notes (Signed)
Primary Care Physician: Zachery Dauer, MD  Primary Gastroenterologist:  Jonette Eva, MD   Chief Complaint  Patient presents with  . Ulcerative Colitis    HPI: Darlene Schwartz is a 49 y.o. female here for follow-up of ulcerative proctitis. She was last seen in July of this year. Patient reports that she was diagnosed as a teenager. Last colonoscopy in April 2016 with erythema and edema in the rectum without ulceration starting at the anal verge and extending 18-19 cm. Moderate to large external hemorrhoids. Biopsy showed quiescent chronic colitis of the rectum, benign biopsy from the right left colon. At that time she was advised to stop Asacol call per Dr. Darrick Penna, continue Canasa suppository twice a day, hydrocortisone enemas for 3 weeks. She was referred to Dr. Lovell Sheehan for evaluation for hemorrhoidectomy but she was unable to keep that appointment due to her mother's illness. She did not tolerate hydrocortisone enemas due to cramping.  Patient told me that being off of Asacol really didnt make much difference back in July when I saw her but since that time she's had more flares. Patient states on previous colonoscopies throughout the years she has had colitis in other locations of her colon besides her rectum. She wonders if the suppositories are inadequate coverage for her.  She has abdominal pain, multiple locations. She feels a pulling sensation in the right lower quadrant. States she had extensive endometriosis requiring complete hysterectomy when she was in her 78s. Seems to be worse when she has a flare. Also has had increase urinary frequency in the setting of flares but no dysuria, nocturia. Recent urinalysis unremarkable per patient. Bowel movements are more loose, 3 daily, postprandial urgency. No blood in the stool. Bentyl is less effective, she continues to take it 4 times daily. Complains of terrible fatigue. A lot of abdominal cramping. Epigastric and left upper quadrant pain as  well. Really unsure whether related to meals or BMs.  Quit Nexium on her own due to concerns for kidney issues, given she has only one kidney. She saw on TV. Really hasn't had any issues with heartburn.  Avoids humira, worried about suppressing her immune system too much, gets sick with everything already. Also concerns about kidney problems since shey has one kidney remaining. 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   Put back on prednisone for RA, currently on 2.5mg  daily.  Current Outpatient Prescriptions  Medication Sig Dispense Refill  . acetaminophen (TYLENOL) 650 MG CR tablet Take 1,300 mg by mouth daily as needed for pain.     Marland Kitchen amitriptyline (ELAVIL) 50 MG tablet Take 50 mg by mouth at bedtime.    Marland Kitchen atenolol (TENORMIN) 25 MG tablet Take 50 mg by mouth 2 (two) times daily.     . citalopram (CELEXA) 40 MG tablet Take 20 mg by mouth at bedtime.     . dicyclomine (BENTYL) 10 MG capsule TAKE 1 CAPSULE (10 MG TOTAL) BY MOUTH 4 (FOUR) TIMES DAILY AS NEEDED. 120 capsule 5  . fluticasone (FLONASE) 50 MCG/ACT nasal spray Place 2 sprays into the nose daily.    Marland Kitchen loratadine (CLARITIN REDITABS) 10 MG dissolvable tablet Take 10 mg by mouth every morning.    Marland Kitchen LORazepam (ATIVAN) 0.5 MG tablet Take 0.5 mg by mouth 3 (three) times daily as needed for anxiety.     . mesalamine (CANASA) 1000 MG suppository Place 1 suppository (1,000 mg total) rectally at bedtime. 30 suppository 5  . methylphenidate (  RITALIN) 5 MG tablet Take 5 mg by mouth 2 (two) times daily.    . NON FORMULARY Cortizone injections tri monthly   In hip joints    . predniSONE (DELTASONE) 2.5 MG tablet Take 2.5 mg by mouth daily with breakfast.    . tiZANidine (ZANAFLEX) 4 MG tablet Take 4 mg by mouth at bedtime.     No current facility-administered medications for this visit.    Allergies as of 06/28/2015 - Review Complete 06/28/2015  Allergen Reaction Noted  . Penicillins Itching 01/23/2012  .  Adhesive [tape] Rash 01/23/2012   Past Medical History  Diagnosis Date  . Ulcerative colitis (HCC)   . Anxiety   . Pulmonary embolism (HCC)     occured after nephrectomy; was on blood thinners for about 6 months post nephrectomy.  Marland Kitchen Anxiety   . Panic attacks   . Fibromyalgia   . Chronic sinus infection   . Vertigo   . Hypertension   . ADD (attention deficit disorder)   . Chronic kidney disease     right nephrectomy  . GERD (gastroesophageal reflux disease)   . Arthritis    Past Surgical History  Procedure Laterality Date  . Tonsillectomy    . Abdominal hysterectomy    . Nephrectomy  2008    right side  . Knee arthroscopy Bilateral   . Diagnostic laparoscopy    . Colonoscopy with propofol N/A 11/23/2014    SLF: 1. Normal Ileum 2. Left colon is redundant 3. Moderate proctitis.   . Esophageal biopsy  11/23/2014    Procedure: BIOPSY;  Surgeon: West Bali, MD;  Location: [facility];  Service: Endoscopy;;  Right Colon, Left Colon, Rectal    ROS:  General: Negative for anorexia, weight loss, fever, chills. +, fatigue, weakness. ENT: Negative for hoarseness, difficulty swallowing , nasal congestion. CV: Negative for chest pain, angina, palpitations, dyspnea on exertion, peripheral edema.  Respiratory: Negative for dyspnea at rest, dyspnea on exertion, cough, sputum, wheezing.  GI: See history of present illness. GU:  Negative for dysuria, hematuria, urinary incontinence, urinary frequency, nocturnal urination.  Endo: Negative for unusual weight change.    Physical Examination:   BP 113/75 mmHg  Pulse 77  Temp(Src) 97.6 F (36.4 C) (Oral)  Ht 5\' 6"  (1.676 m)  Wt 240 lb 6.4 oz (109.045 kg)  BMI 38.82 kg/m2  General: Well-nourished, well-developed in no acute distress.  Eyes: No icterus. Mouth: Oropharyngeal mucosa moist and pink , no lesions erythema or exudate. Lungs: Clear to auscultation bilaterally.  Heart: Regular rate and rhythm, no murmurs rubs or gallops.    Abdomen: Bowel sounds are normal, tender diffuse, nondistended, no hepatosplenomegaly or masses, no abdominal bruits or hernia , no rebound or guarding.   Extremities: No lower extremity edema. No clubbing or deformities. Neuro: Alert and oriented x 4   Skin: Warm and dry, no jaundice.   Psych: Alert and cooperative, normal mood and affect.  Labs:  Lab Results  Component Value Date   WBC 9.6 11/19/2014   HGB 13.5 11/19/2014   HCT 41.4 11/19/2014   MCV 95.4 11/19/2014   PLT 290 11/19/2014   Lab Results  Component Value Date   CREATININE 1.06 11/19/2014   BUN 19 11/19/2014   NA 141 11/19/2014   K 4.7 11/19/2014   CL 106 11/19/2014   CO2 27 11/19/2014   Lab Results  Component Value Date   ALT 19 01/13/2013   AST 19 01/13/2013   ALKPHOS 88 01/13/2013  BILITOT 0.6 01/13/2013    Imaging Studies: No results found.

## 2015-06-28 NOTE — Patient Instructions (Signed)
1. Start Lialda 4 tablets at breakfast daily for colitis flare. We will reduce your dose to two tablets daily after four weeks and/or flare is under control.  2. We need to check your kidney function after two weeks on the medication.  3. You may be able to stop Canasa suppositories once you are on Lialda for one month and flare under control.  4. I will look into the best biologics options for you. 5. We will request a copy of your most recent labs to have on file.

## 2015-06-29 NOTE — Telephone Encounter (Signed)
LMOM to call.

## 2015-06-30 NOTE — Telephone Encounter (Signed)
Mailed letter for pt to call to discuss meds.

## 2015-06-30 NOTE — Assessment & Plan Note (Signed)
49 year old female who presents for follow-up of distal proctocolitis. Initially she told me she was diagnosed age 65. She states that in her teenage years she had problems as well. She reports having more proximal disease on prior colonoscopies. Had been maintained on Asacol HD in the past. After colonoscopy in April 2016 this was stopped and she was started on Canasa suppositories. She describes having more frequent flares since that time. Currently having loose stools associated with abdominal pain but denies blood in the stool. She has right lower quadrant pain which she feels like a pulling type pain which may be related to adhesions. Describes extensive endometriosis requiring complete hysterectomy at age 41. Suspect abdominal pain multifactorial.  She reports prior CT scans but cannot remember where or how long. She is not interested in pursuing CT scan because she is concerned about IV contrast as she has only one kidney.  At this point would optimize management of her colitis. Begin Lialda 4.8 grams daily initially over the next few weeks with goal of dropping to maintenance dose of 2.4 g. We will recheck met 7 in 2 weeks. If she does not have improvement in her abdominal pain and bowel function, would consider further workup. She is interested in pursuing possible Biologics for management of both her UC and RA and would like for our opinion. Again she has been resistant in the past due to history of one functioning kidney only.

## 2015-07-04 NOTE — Progress Notes (Signed)
CC'D TO PCP °

## 2015-07-11 ENCOUNTER — Telehealth: Payer: Self-pay

## 2015-07-11 NOTE — Telephone Encounter (Signed)
Pt called and wanted to speak to [facility] about her medication management. Please call pt to discuss.

## 2015-07-18 NOTE — Telephone Encounter (Signed)
Please touch base with patient and let her know that I'm not ignoring her but will call her within next day or so. Please explain to her that I have been out with sick children, etc. Obtain any information if possible in the interim.

## 2015-07-18 NOTE — Telephone Encounter (Signed)
Pt is aware and she did not have any new concerns.

## 2015-07-28 NOTE — Progress Notes (Signed)
REVIEWED. PT REPORTS SHE WAS DIAGNOSED WITH LIMITED PROCTOCOLITIS. TCS APR 2016 SHOWED NORMAL COLON MUCOSAL BIOPSIES IN THE RIGHT AND LEFT COLON AND QUEISCENT PROCTITIS. SUPP OR ENEMAS ARE THE BEST DELIVERY METHOD FOR MESALAMINE/HCT UNLESS SHE HAS EVIDENCE OF ACTIVE DISEASE ON IMAGING. IF SHE THINKS SHE IS HAVING A FLARE SHE NEEDS A CT ABD/PLEVIS TO DOCUMENT. I WOULD STOP LIALDA BECAUSE I DOUBT IT IS EFFECTIVE FOR LIMITED PROCTITIS. The blood levels after taking oral mesalamine are higher than for rectal mesalamine. IF TEH CT SHOWS NO ACTIVE DISEASE, SHE IS MORE LIKELY  HAVING A FLARE OF IBS-D AND COULD CONSIDER VIBERZI if abdominal pain and diarrhea are the problem.

## 2015-07-28 NOTE — Telephone Encounter (Signed)
Spoke with patient regarding medication management for her UC.  Discussed recommendations by [facility], see OV note.  Currently patient is on her second round of antibiotics for upper respiratory infection. Initially took Z-Pak. Currently on clindamycin. She has had some increased frequency of stools. No blood in the stool. Has not really been able to tell whether Lialda has helped in the past 4 weeks.  Recently saw rheumatologist for RA. She discussed with him that we were considering Humira for her IBD and she wanted to know if it be beneficial for her RA as well. Rheumatologist wants to manage her RA solely and any Biologics if they were to be started.  Discussed with patient that Dr. Darrick Penna recommends topical mesalamine therapy based on colonoscopy findings in April. Colonoscopy was performed while she was on is a call at the time. Since that time she came off of systemic mesalamine and feels like she has had flares. Dr. Darrick Penna recommend CT abdomen and pelvis to look for any active disease more proximally to determine whether she needs mesalamine therapy. It is felt given that she has a single kidney that this be in her best interest not to use oral mesalamine unless necessary. If no evidence of active disease, then we could concentrate on IBS D therapy and consider Viberzi for abdominal pain and diarrhea.  As of right now, patient will complete clindamycin. She will call in 2 weeks and let me know how she is doing. If she is having ongoing diarrhea, increased frequency from baseline, would advise C. difficile testing. Continue Nedra Hai auto until we talk in 2 weeks and at that point we'll likely discontinue therapy but did not want to have her stop everything at the same time. Patient will be deciding whether or not she wants to pursue CT, we did discuss possible oral contrast only given that she has a single kidney.

## 2015-07-28 NOTE — Progress Notes (Addendum)
Spoke with patient regarding medication management for her UC.  Discussed recommendations by [facility], see OV note.  Currently patient is on her second round of antibiotics for upper respiratory infection. Initially took Z-Pak. Currently on clindamycin. She has had some increased frequency of stools. No blood in the stool. Has not really been able to tell whether Lialda has helped in the past 4 weeks.  Recently saw rheumatologist for RA. She discussed with him that we were considering Humira for her IBD and she wanted to know if it be beneficial for her RA as well. Rheumatologist wants to manage her RA solely and any Biologics if they were to be started.  Discussed with patient that Dr. Darrick Penna recommends topical mesalamine therapy (no indication for Humira or Imura) based on colonoscopy findings in April. Colonoscopy was performed while she was on is a call at the time. Since that time she came off of systemic mesalamine and feels like she has had flares. Dr. Darrick Penna recommend CT abdomen and pelvis to look for any active disease more proximally to determine whether she needs mesalamine therapy. It is felt given that she has a single kidney that this be in her best interest not to use oral mesalamine unless necessary. If no evidence of active disease, then we could concentrate on IBS D therapy and consider Viberzi for abdominal pain and diarrhea.  As of right now, patient will complete clindamycin. She will call in 2 weeks and let me know how she is doing. If she is having ongoing diarrhea, increased frequency from baseline, would advise C. difficile testing. Continue Nedra Hai auto until we talk in 2 weeks and at that point we'll likely discontinue therapy but did not want to have her stop everything at the same time. Patient will be deciding whether or not she wants to pursue CT, we did discuss possible oral contrast only given that she has a single kidney.

## 2016-01-03 ENCOUNTER — Telehealth: Payer: Self-pay

## 2016-01-03 MED ORDER — DICYCLOMINE HCL 10 MG PO CAPS: ORAL_CAPSULE | ORAL | Status: DC

## 2016-01-03 NOTE — Telephone Encounter (Signed)
T/C from Lauren at [facility] in [place]  ( [phone])  Pt is [facility] and would like a prescription for the Dicyclomine 10 mg called in. Per Leotis Shames, [facility] told [facility] pt last had it in 03/2015.

## 2016-01-03 NOTE — Telephone Encounter (Signed)
Completed.

## 2016-06-03 ENCOUNTER — Emergency Department (HOSPITAL_COMMUNITY)
Admission: EM | Admit: 2016-06-03 | Discharge: 2016-06-03 | Disposition: A | Discharge status: Home / Self Care | Payer: BLUE CROSS/BLUE SHIELD | Attending: Emergency Medicine | Admitting: Emergency Medicine

## 2016-06-03 ENCOUNTER — Encounter (HOSPITAL_COMMUNITY): Payer: Self-pay | Admitting: Emergency Medicine

## 2016-06-03 ENCOUNTER — Emergency Department (HOSPITAL_COMMUNITY): Payer: BLUE CROSS/BLUE SHIELD

## 2016-06-03 DIAGNOSIS — N189 Chronic kidney disease, unspecified: Secondary | ICD-10-CM | POA: Diagnosis not present

## 2016-06-03 DIAGNOSIS — Z79899 Other long term (current) drug therapy: Secondary | ICD-10-CM | POA: Diagnosis not present

## 2016-06-03 DIAGNOSIS — R42 Dizziness and giddiness: Secondary | ICD-10-CM | POA: Diagnosis not present

## 2016-06-03 DIAGNOSIS — I129 Hypertensive chronic kidney disease with stage 1 through stage 4 chronic kidney disease, or unspecified chronic kidney disease: Secondary | ICD-10-CM | POA: Diagnosis not present

## 2016-06-03 DIAGNOSIS — G44209 Tension-type headache, unspecified, not intractable: Secondary | ICD-10-CM | POA: Insufficient documentation

## 2016-06-03 DIAGNOSIS — R51 Headache: Secondary | ICD-10-CM | POA: Diagnosis present

## 2016-06-03 DIAGNOSIS — F909 Attention-deficit hyperactivity disorder, unspecified type: Secondary | ICD-10-CM | POA: Diagnosis not present

## 2016-06-03 NOTE — Discharge Instructions (Signed)
Your CT scan is negative for any acute problem. Your neurologic examination does not show acute neurologic deficit. Please change positions slowly. Please see the neurology specialist as scheduled.

## 2016-06-03 NOTE — ED Provider Notes (Signed)
[facility]-EM[facility]Y DEPT Provider Note   CSN: [id number] Arrival date & time: 06/03/16  1221   .By signing my name below, I, Valentino Saxon, attest that this documentation has been prepared under the direction and in the presence of Ivery Quale, [place] Electronically Signed: Valentino Saxon, ED Scribe. 06/03/16. 1:52 PM.  History   Chief Complaint Chief Complaint  Patient presents with  . Otalgia    The history is provided by the patient. No language interpreter was used.   HPI Comments: Darlene Schwartz is a 50 y.o. female h/o of intermittent vertigo who presents to the Emergency Department complaining of moderate, constant, HA and jaw pain onset last month. Pt reports progressively worsening jaw pain with left sided ear pain within the last two weeks. Pt notes ear pain is exacerbated when going from a sitting position to standing position. She reports associated tingling in ears with jaw pain and HA. She states ear pain episodes typically last for about 10-20 minutes at time. Pt also reports having anxiety when moving from sitting to standing position because she is unaware when episodes may occur. She notes seeing her GP for similar symptoms two weeks ago and she was referred to a neurologist. Pt notes no modifying factors noted. She denies ear discharge, fever, chills. Pt also reports having horrible flares of rheumatoidal arthritis all over her body with associated body aches and severe pain. She states being in bed all day yesterday. She notes taking 3 prescribed oxycodone's throughout the day yesterday with no relief. She denies numbness and swelling. No additional complaints at this time.    Past Medical History:  Diagnosis Date  . ADD (attention deficit disorder)   . Anxiety   . Anxiety   . Arthritis   . Chronic kidney disease    right nephrectomy  . Chronic sinus infection   . Fibromyalgia   . GERD (gastroesophageal reflux disease)   . Hypertension   . Panic attacks   . Pulmonary  embolism (HCC)    occured after nephrectomy; was on blood thinners for about 6 months post nephrectomy.  Marland Kitchen Ulcerative colitis (HCC)   . Vertigo     Patient Active Problem List   Diagnosis Date Noted  . Ulcerative colitis (HCC) 10/07/2014    Past Surgical History:  Procedure Laterality Date  . ABDOMINAL HYSTERECTOMY    . BIOPSY  11/23/2014   Procedure: BIOPSY;  Surgeon: West Bali, MD;  Location: [facility];  Service: Endoscopy;;  Right Colon, Left Colon, Rectal  . COLONOSCOPY WITH PROPOFOL N/A 11/23/2014   SLF: 1. Normal Ileum 2. Left colon is redundant 3. Moderate proctitis.   Marland Kitchen DIAGNOSTIC LAPAROSCOPY    . KNEE ARTHROSCOPY Bilateral   . NEPHRECTOMY  2008   right side  . TONSILLECTOMY      OB History    No data available       Home Medications    Prior to Admission medications   Medication Sig Start Date End Date Taking? Authorizing Provider  acetaminophen (TYLENOL) 650 MG CR tablet Take 1,300 mg by mouth daily as needed for pain.     Historical Provider, MD  amitriptyline (ELAVIL) 50 MG tablet Take 50 mg by mouth at bedtime.    Historical Provider, MD  atenolol (TENORMIN) 25 MG tablet Take 50 mg by mouth 2 (two) times daily.     Historical Provider, MD  citalopram (CELEXA) 40 MG tablet Take 20 mg by mouth at bedtime.     Historical Provider, MD  dicyclomine (BENTYL) 10 MG capsule TAKE 1 CAPSULE (10 MG TOTAL) BY MOUTH 4 (FOUR) TIMES DAILY AS NEEDED. 01/03/16   Gelene Mink, NP  fluticasone [facility]) 50 MCG/ACT nasal spray Place 2 sprays into the nose daily.    Historical Provider, MD  loratadine (CLARITIN REDITABS) 10 MG dissolvable tablet Take 10 mg by mouth every morning.    Historical Provider, MD  LORazepam (ATIVAN) 0.5 MG tablet Take 0.5 mg by mouth 3 (three) times daily as needed for anxiety.     Historical Provider, MD  mesalamine (CANASA) 1000 MG suppository Place 1 suppository (1,000 mg total) rectally at bedtime. 05/31/15   Tiffany Kocher, PA-C  mesalamine (LIALDA)  1.2 G EC tablet Take 4 tablets (4.8 g total) by mouth daily with breakfast. 06/28/15   Tiffany Kocher, PA-C  methylphenidate (RITALIN) 5 MG tablet Take 5 mg by mouth 2 (two) times daily.    Historical Provider, MD  NON FORMULARY Cortizone injections tri monthly   In hip joints    Historical Provider, MD  predniSONE (DELTASONE) 2.5 MG tablet Take 2.5 mg by mouth daily with breakfast.    Historical Provider, MD  tiZANidine (ZANAFLEX) 4 MG tablet Take 4 mg by mouth at bedtime.    Historical Provider, MD    Family History Family History  Problem Relation Age of Onset  . Colon cancer Paternal Grandfather 57  . Ulcerative colitis Cousin   . Rheum arthritis Mother   . Rheum arthritis Paternal Grandmother   . Colon polyps Neg Hx     Social History Social History  Substance Use Topics  . Smoking status: Never Smoker  . Smokeless tobacco: Never Used     Comment: Never smoked  . Alcohol use No     Allergies   Penicillins and Adhesive [tape]   Review of Systems Review of Systems  Constitutional: Negative for chills and fever.  HENT: Positive for ear pain. Negative for ear discharge.        + jaw pain   Musculoskeletal: Positive for myalgias. Negative for joint swelling.  Neurological: Positive for headaches. Negative for numbness.  Psychiatric/Behavioral: The patient is nervous/anxious.   All other systems reviewed and are negative.    Physical Exam Updated Vital Signs BP 122/68 (BP Location: Left Arm)   Pulse 86   Temp 98.4 F (36.9 C) (Oral)   Resp 16   Ht 5\' 6"  (1.676 m)   Wt 248 lb (112.5 kg)   SpO2 98%   BMI 40.03 kg/m   Physical Exam  Constitutional: She appears well-developed and well-nourished.  HENT:  Head: Normocephalic and atraumatic.  Eyes: Conjunctivae are normal. Right eye exhibits no discharge. Left eye exhibits no discharge.  Neck:  No carotid bruits. Mild crepitus left greater than right.  Pulmonary/Chest: Effort normal and breath sounds normal. No  respiratory distress.  Musculoskeletal:  Capillary refill less than 2 sec. Radial pulse is 2+.  Neurological: She is alert. Coordination normal.  No atrophy of thenar eminence. Grip is symmetrical. No motor deficit in lower extremities. Cranial nerves intact.   Skin: Skin is warm and dry. No rash noted. She is not diaphoretic. No erythema.  Psychiatric: She has a normal mood and affect.  Nursing note and vitals reviewed.    ED Treatments / Results   DIAGNOSTIC STUDIES: Oxygen Saturation is 98% on RA, normal by my interpretation.    COORDINATION OF CARE: 1:35 PM Discussed treatment plan with pt at bedside which includes CT w/o contrast  and pt agreed to plan.  Labs (all labs ordered are listed, but only abnormal results are displayed) Labs Reviewed - No data to display  EKG  EKG Interpretation None       Radiology No results found.  Procedures Procedures (including critical care time)  Medications Ordered in ED Medications - No data to display   Initial Impression / Assessment and Plan / ED Course  I have reviewed the triage vital signs and the nursing notes.  Pertinent labs & imaging results that were available during my care of the patient were reviewed by me and considered in my medical decision making (see chart for details).  Clinical Course    *I have reviewed nursing notes, vital signs, and all appropriate lab and imaging results for this patient.**  Final Clinical Impressions(s) / ED Diagnoses  Vital signs within normal limits. Patient is ambulatory. She has some difficulty with balance in feeling of lightheadedness with change of position from sitting to standing. CT scan is negative for acute problem. No gross neurologic deficit noted on examination. I've asked patient to see her neurology specialist as previously prescribed. She will return to the emergency department if any changes or problems.    Final diagnoses:  None    New Prescriptions New  Prescriptions   No medications on file    **I personally performed the services described in this documentation, which was scribed in my presence. The recorded information has been reviewed and is accurate.Ivery Quale, PA-C 06/03/16 1505    Lavera Guise, MD 06/03/16 [phone]

## 2016-06-03 NOTE — ED Notes (Signed)
Returned from CT.

## 2016-06-03 NOTE — ED Triage Notes (Signed)
Patient complains of bilateral ear pain for past 2 months. States has gone to dentist and nothing seems to help.

## 2016-06-03 NOTE — ED Notes (Signed)
Patient transported to CT 

## 2017-05-07 ENCOUNTER — Ambulatory Visit: Payer: BLUE CROSS/BLUE SHIELD | Admitting: Orthopaedic Surgery

## 2017-05-16 ENCOUNTER — Encounter: Payer: Self-pay | Admitting: Orthopaedic Surgery

## 2017-05-16 ENCOUNTER — Ambulatory Visit (INDEPENDENT_AMBULATORY_CARE_PROVIDER_SITE_OTHER): Payer: BLUE CROSS/BLUE SHIELD | Admitting: Orthopaedic Surgery

## 2017-05-16 VITALS — BP 101/64 | HR 77 | Temp 98.6°F | Ht 65.0 in | Wt 259.0 lb

## 2017-05-16 DIAGNOSIS — M7711 Lateral epicondylitis, right elbow: Secondary | ICD-10-CM | POA: Diagnosis not present

## 2017-05-16 DIAGNOSIS — M0579 Rheumatoid arthritis with rheumatoid factor of multiple sites without organ or systems involvement: Secondary | ICD-10-CM

## 2017-05-16 DIAGNOSIS — M069 Rheumatoid arthritis, unspecified: Secondary | ICD-10-CM | POA: Insufficient documentation

## 2017-05-16 NOTE — Progress Notes (Signed)
Subjective:    Patient ID: Darlene Schwartz, female    DOB: [birth year], 51 y.o.   MRN: [id number]  HPI She has rheumatoid arthritis and is being treated for this.  She has had recent injection of prednisone and also taken prednisone orally for this.   She has several month history of increasing pain of the right lateral elbow.  She has no trauma.  She has pain picking up objects. She has pain in wringing rags. She has no redness or swelling or numbness.  She has tried heat and ice with little help.   Review of Systems  HENT: Negative for congestion.   Respiratory: Negative for cough and shortness of breath.   Cardiovascular: Negative for chest pain and leg swelling.  Endocrine: Negative for cold intolerance.  Musculoskeletal: Positive for arthralgias, joint swelling and myalgias.  Allergic/Immunologic: Negative for environmental allergies.   Past Medical History:  Diagnosis Date  . ADD (attention deficit disorder)   . Anxiety   . Anxiety   . Arthritis   . Chronic kidney disease    right nephrectomy  . Chronic sinus infection   . Fibromyalgia   . GERD (gastroesophageal reflux disease)   . History of blood clots   . Hypertension   . Panic attacks   . Pulmonary embolism (HCC)    occured after nephrectomy; was on blood thinners for about 6 months post nephrectomy.  . Rheumatoid arthritis (HCC)   . Ulcerative colitis (HCC)   . Vertigo     Past Surgical History:  Procedure Laterality Date  . ABDOMINAL HYSTERECTOMY    . BIOPSY  11/23/2014   Procedure: BIOPSY;  Surgeon: West Bali, MD;  Location: [facility];  Service: Endoscopy;;  Right Colon, Left Colon, Rectal  . COLONOSCOPY WITH PROPOFOL N/A 11/23/2014   SLF: 1. Normal Ileum 2. Left colon is redundant 3. Moderate proctitis.   Marland Kitchen DIAGNOSTIC LAPAROSCOPY    . KNEE ARTHROSCOPY Bilateral   . NEPHRECTOMY  2008   right side  . TONSILLECTOMY      Current Outpatient Prescriptions on File Prior to Visit  Medication Sig Dispense  Refill  . acetaminophen (TYLENOL) 650 MG CR tablet Take 1,300 mg by mouth daily as needed for pain.     Marland Kitchen amitriptyline (ELAVIL) 50 MG tablet Take 50 mg by mouth at bedtime.    Marland Kitchen atenolol (TENORMIN) 25 MG tablet Take 50 mg by mouth 2 (two) times daily.     . citalopram (CELEXA) 40 MG tablet Take 20 mg by mouth at bedtime.     . dicyclomine (BENTYL) 10 MG capsule TAKE 1 CAPSULE (10 MG TOTAL) BY MOUTH 4 (FOUR) TIMES DAILY AS NEEDED. 120 capsule 5  . fluticasone (FLONASE) 50 MCG/ACT nasal spray Place 2 sprays into the nose daily.    Marland Kitchen loratadine (CLARITIN REDITABS) 10 MG dissolvable tablet Take 10 mg by mouth every morning.    Marland Kitchen LORazepam (ATIVAN) 0.5 MG tablet Take 0.5 mg by mouth 3 (three) times daily as needed for anxiety.     . mesalamine (CANASA) 1000 MG suppository Place 1 suppository (1,000 mg total) rectally at bedtime. 30 suppository 5  . mesalamine (LIALDA) 1.2 G EC tablet Take 4 tablets (4.8 g total) by mouth daily with breakfast. 120 tablet 3  . methylphenidate (RITALIN) 5 MG tablet Take 5 mg by mouth 2 (two) times daily.    . NON FORMULARY Cortizone injections tri monthly   In hip joints    . predniSONE (DELTASONE)  2.5 MG tablet Take 2.5 mg by mouth daily with breakfast.    . tiZANidine (ZANAFLEX) 4 MG tablet Take 4 mg by mouth at bedtime.     No current facility-administered medications on file prior to visit.     Social History   Social History  . Marital status: Married    Spouse name: N/A  . Number of children: N/A  . Years of education: N/A   Occupational History  . Not on file.   Social History Main Topics  . Smoking status: Never Smoker  . Smokeless tobacco: Never Used     Comment: Never smoked  . Alcohol use No  . Drug use: No  . Sexual activity: Yes    Birth control/ protection: Surgical   Other Topics Concern  . Not on file   Social History Narrative   ADOPTED TWIN BOYS: AGE 9. MARRIED-4 YRS(2ND MARRIAGE). 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 WORKS PART TIME AS  VOTING REGISTRATION PERSON. FORMER COMPETITIVE SKATER.    Family History  Problem Relation Age of Onset  . Colon cancer Paternal Grandfather 8  . Ulcerative colitis Cousin   . Rheum arthritis Mother   . Rheum arthritis Paternal Grandmother   . Diverticulitis Father   . Colon polyps Neg Hx     BP 101/64   Pulse 77   Temp 98.6 F (37 C)   Ht 5\' 5"  (1.651 m)   Wt 259 lb (117.5 kg)   BMI 43.10 kg/m       Objective:   Physical Exam  Constitutional: She is oriented to person, place, and time. She appears well-developed and well-nourished.  HENT:  Head: Normocephalic and atraumatic.  Eyes: Pupils are equal, round, and reactive to light. Conjunctivae and EOM are normal.  Neck: Normal range of motion. Neck supple.  Cardiovascular: Normal rate, regular rhythm and intact distal pulses.   Pulmonary/Chest: Effort normal.  Abdominal: Soft.  Musculoskeletal: She exhibits tenderness (Pain of the right lateral epicondyle, ROM of right elbow full, pain to resisted extension of fingers or wrist, no color changes, left elbow normal.).  Neurological: She is alert and oriented to person, place, and time. She displays normal reflexes. No cranial nerve deficit. She exhibits normal muscle tone. Coordination normal.  Skin: Skin is warm and dry.  Psychiatric: She has a normal mood and affect. Her behavior is normal. Judgment and thought content normal.  Vitals reviewed.         Assessment & Plan:   Encounter Diagnoses  Name Primary?  . Lateral epicondylitis of right elbow Yes  . Rheumatoid arthritis involving multiple sites with positive rheumatoid factor (HCC)    I have explained what tennis elbow is. I have explained about ice massage and how to do this.  I have explained that tennis elbow is hard to get rid of. It takes time.  Return in three weeks.  Call if any problem.  Precautions discussed.   Electronically Signed , MD 10/4/20189:50 AM

## 2017-06-06 ENCOUNTER — Ambulatory Visit: Payer: BLUE CROSS/BLUE SHIELD | Admitting: Orthopaedic Surgery

## 2017-10-28 ENCOUNTER — Other Ambulatory Visit: Payer: Self-pay | Admitting: Gastroenterology

## 2018-05-12 ENCOUNTER — Other Ambulatory Visit: Payer: Self-pay | Admitting: Nurse Practitioner

## 2018-06-05 NOTE — Progress Notes (Signed)
REVIEWED-NO ADDITIONAL RECOMMENDATIONS. 

## 2018-10-14 ENCOUNTER — Encounter: Payer: Self-pay | Admitting: Neurology

## 2018-10-14 ENCOUNTER — Ambulatory Visit: Payer: 59 | Admitting: Neurology

## 2018-10-14 DIAGNOSIS — R413 Other amnesia: Secondary | ICD-10-CM

## 2018-10-14 MED ORDER — AMPHETAMINE-DEXTROAMPHETAMINE 10 MG PO TABS: 10.0000 mg | ORAL_TABLET | Freq: Every day | ORAL | 0 refills | Status: DC

## 2018-10-14 NOTE — Progress Notes (Signed)
Reason for visit: Fibromyalgia, memory disturbance, headache, rheumatoid arthritis  Referring physician: Dr. Francesco Sor Darlene Schwartz is a 53 y.o. female  History of present illness:  Darlene Schwartz is a 53 year old left-handed white female with a history of obesity, attention deficit disorder, fibromyalgia, ulcerative colitis, rheumatoid arthritis, and headache.  The patient comes in today with a multitude of somatic complaints.  She reports that she has diffuse pain with trigger points that are present on the arms and shoulders and back and hips.  She has had a 6-year history of some vertigo problems that may be worse at times than others.  She reports a "brain fog" and difficulty with focusing associated with her fibromyalgia.  She has had a longstanding history of headaches that have worsened some since a fall and a mild concussion that occurred in 2019.  The patient has had some problems with slurred speech, short-term memory problems, misplacing things about the house.  She has numbness in the face and tongue and lips that comes and goes.  The patient has some numbness in the arms and feet and in the right groin area as well.  She has some problems with gait instability, she may stumble on occasion.  She may have a sensation of water running down the legs at times.  She has reported decreased vision, decreased hearing on the right, anxiety and depression, fatigue, insomnia that is chronic in nature.  She has joint pains, and problems with sweats and chills.  She claims that her headache is mainly in the back of the head, it is usually mild but they have been more severe and is daily in nature associated with some photophobia and phonophobia, occasional nausea.  She claims her mother also has headaches.  She finds that she has some troubles with shortness of breath and wheezing and occasional difficulty swallowing.  She is being followed through rheumatologist who just recently has done extensive blood work,  she does not know what was drawn or what the results were.  In the past, gabapentin has increased her problems with focusing and memory.  She has not tried Lyrica previously.  She is on amitriptyline, she is still not sleeping on 50 mg at night, she claims that her doctor is planning on increasing the dose to 75 mg.  Past Medical History:  Diagnosis Date  . ADD (attention deficit disorder)   . Anxiety   . Anxiety   . Arthritis   . Chronic kidney disease    right nephrectomy  . Chronic sinus infection   . Fibromyalgia   . GERD (gastroesophageal reflux disease)   . History of blood clots   . Hypertension   . IBS (irritable bowel syndrome)   . Panic attacks   . Pulmonary embolism (HCC)    occured after nephrectomy; was on blood thinners for about 6 months post nephrectomy.  . Rheumatoid arthritis (HCC)   . Ulcerative colitis (HCC)   . Vertigo     Past Surgical History:  Procedure Laterality Date  . ABDOMINAL HYSTERECTOMY    . BIOPSY  11/23/2014   Procedure: BIOPSY;  Surgeon: West Bali, MD;  Location: [facility];  Service: Endoscopy;;  Right Colon, Left Colon, Rectal  . COLONOSCOPY WITH PROPOFOL N/A 11/23/2014   SLF: 1. Normal Ileum 2. Left colon is redundant 3. Moderate proctitis.   [facility] Kitchen DIAGNOSTIC LAPAROSCOPY    . KNEE ARTHROSCOPY Bilateral   . NEPHRECTOMY  2008   right side  . TONSILLECTOMY  Family History  Problem Relation Age of Onset  . Colon cancer Paternal Grandfather 90  . Ulcerative colitis Cousin   . Rheum arthritis Mother   . Rheum arthritis Paternal Grandmother   . Diverticulitis Father   . Prostate cancer Father   . Colon polyps Neg Hx     Social history:  reports that she has never smoked. She has never used smokeless tobacco. She reports that she does not drink alcohol or use drugs.  Medications:  Prior to Admission medications   Medication Sig Start Date End Date Taking? Authorizing Provider  acetaminophen (TYLENOL) 650 MG CR tablet Take 1,300 mg by  mouth daily as needed for pain.    Yes [provider]  amitriptyline (ELAVIL) 50 MG tablet Take 50 mg by mouth at bedtime.   Yes [provider]  atenolol (TENORMIN) 25 MG tablet Take 50 mg by mouth 2 (two) times daily.    Yes [provider]  buPROPion (WELLBUTRIN XL) 150 MG 24 hr tablet  10/13/18  Yes [provider]  citalopram (CELEXA) 40 MG tablet Take 20 mg by mouth at bedtime.    Yes [provider]  dicyclomine (BENTYL) 10 MG capsule TAKE 1 CAPSULE (10 MG TOTAL) BY MOUTH 4 (FOUR) TIMES DAILY AS NEEDED. 05/12/18  Yes Gelene Mink, NP  fluticasone (FLONASE) 50 MCG/ACT nasal spray Place 2 sprays into the nose daily.   Yes [provider]  HYDROcodone-acetaminophen (NORCO) 10-325 MG tablet Take 1 tablet by mouth every 6 (six) hours as needed. 2-3 times daily   Yes [provider]  loratadine (CLARITIN REDITABS) 10 MG dissolvable tablet Take 10 mg by mouth every morning.   Yes [provider]  LORazepam (ATIVAN) 0.5 MG tablet Take 0.5 mg by mouth 3 (three) times daily as needed for anxiety.    Yes [provider]  NON FORMULARY Cortizone injections tri monthly   In hip joints   Yes [provider]  predniSONE (DELTASONE) 10 MG tablet Take 10 mg by mouth daily with breakfast.   Yes [provider]  predniSONE (DELTASONE) 2.5 MG tablet Take 2.5 mg by mouth daily with breakfast.   Yes [provider]  tiZANidine (ZANAFLEX) 4 MG tablet Take 4 mg by mouth at bedtime.   Yes [provider]      Allergies  Allergen Reactions  . Other     Pnemonia vaccine  . Penicillins Itching  . Adhesive [Tape] Rash    ROS:  Out of a complete 14 system review of symptoms, the patient complains only of the following symptoms, and all other reviewed systems are negative.  Chills, fatigue, palpitations of the heart, hearing loss, dizziness, itching, blurred vision, shortness of breath, wheezing,  snoring, diarrhea, constipation Easy bruising Feeling hot, cold Joint pain, muscle cramps, aching muscles Allergies, runny nose, frequent infections Memory loss, confusion, headache, numbness, weakness, slurred speech, difficulty swallowing, dizziness, passing out Depression, anxiety, too much sleep, not enough sleep, decreased energy, disinterest in activities, suicidal thoughts, racing thoughts Insomnia, restless legs  Blood pressure 117/76, pulse 88, height 5\' 5"  (1.651 m), weight 253 lb (114.8 kg).  Physical Exam  General: The patient is alert and cooperative at the time of the examination.  The patient is markedly obese.  Eyes: Pupils are equal, round, and reactive to light. Discs are flat bilaterally.  Neck: The neck is supple, no carotid bruits are noted.  Respiratory: The respiratory examination is clear.  Cardiovascular: The cardiovascular examination  reveals a regular rate and rhythm, no obvious murmurs or rubs are noted.  Skin: Extremities are without significant edema.  Neurologic Exam  Mental status: The patient is alert and oriented x 3 at the time of the examination. The patient has apparent normal recent and remote memory, with an apparently normal attention span and concentration ability.  Cranial nerves: Facial symmetry is present. There is good sensation of the face to pinprick and soft touch bilaterally. The strength of the facial muscles and the muscles to head turning and shoulder shrug are normal bilaterally. Speech is well enunciated, no aphasia or dysarthria is noted. Extraocular movements are full. Visual fields are full. The tongue is midline, and the patient has symmetric elevation of the soft palate. No obvious hearing deficits are noted.  Motor: The motor testing reveals 5 over 5 strength of all 4 extremities. Good symmetric motor tone is noted throughout.  Sensory: Sensory testing is intact to pinprick, soft touch, vibration sensation, and position  sense on all 4 extremities. No evidence of extinction is noted.  Coordination: Cerebellar testing reveals good finger-nose-finger and heel-to-shin bilaterally.  Gait and station: Gait is normal. Tandem gait is normal. Romberg is negative. No drift is seen.  Reflexes: Deep tendon reflexes are symmetric and normal bilaterally. Toes are downgoing bilaterally.   Assessment/Plan:  1.  Fibromyalgia  2.  History of rheumatoid arthritis  3.  Ulcerative colitis  4.  Chronic insomnia, chronic fatigue  5.  Headache, possible migraine  6.  Reports of memory disturbance  The patient has a multitude of somatic complaints.  Many of her complaints may be related to her fibromyalgia with diffuse migratory pain, difficulty with concentration, insomnia, and chronic fatigue.  The patient may go up in the near future on amitriptyline which may help her sleep and may help her headaches.  We will check MRI of the brain given her reports of memory issues and numbness and headache and vertigo.  The patient will be placed on Adderall 10 mg daily.  She will follow-up here in about 6 months.  Marlan Palau. Keith  MD 10/14/2018 3:01 PM  [facility] [address] [place] [id number]  Phone [phone] Fax [phone]

## 2018-10-14 NOTE — Patient Instructions (Signed)
We will start Adderall in the morning. Try to exercise on a regular basis.

## 2018-10-16 ENCOUNTER — Telehealth: Payer: Self-pay | Admitting: Neurology

## 2018-10-16 NOTE — Telephone Encounter (Signed)
lvm for pt to call back to see if she would like to have MRI at [facility] [id number] (exp. 10/16/18 to 04/14/19)

## 2018-10-16 NOTE — Telephone Encounter (Signed)
Patient called back and would like her MRI at [facility]. She is scheduled for Tuesday 10/21/18 arrival time is 10 AM. Patient is aware of time and day. And stated she had their number.

## 2018-10-21 ENCOUNTER — Telehealth: Payer: Self-pay | Admitting: Neurology

## 2018-10-21 NOTE — Telephone Encounter (Signed)
I called the patient.  MRI of the brain was completely normal, memory issues may be related to sleep issues or related to concentration issues associated with fibromyalgia.   MRI brain without contrast October 21, 2018:  Impression:  Unremarkable MRI brain.

## 2018-12-09 NOTE — Progress Notes (Signed)
REVIEWED-NO ADDITIONAL RECOMMENDATIONS. 

## 2019-01-02 ENCOUNTER — Encounter (HOSPITAL_COMMUNITY): Payer: Self-pay | Admitting: Emergency Medicine

## 2019-01-02 ENCOUNTER — Other Ambulatory Visit: Payer: Self-pay

## 2019-01-02 DIAGNOSIS — X509XXA Other and unspecified overexertion or strenuous movements or postures, initial encounter: Secondary | ICD-10-CM | POA: Insufficient documentation

## 2019-01-02 DIAGNOSIS — Y929 Unspecified place or not applicable: Secondary | ICD-10-CM | POA: Insufficient documentation

## 2019-01-02 DIAGNOSIS — Y939 Activity, unspecified: Secondary | ICD-10-CM | POA: Diagnosis not present

## 2019-01-02 DIAGNOSIS — I129 Hypertensive chronic kidney disease with stage 1 through stage 4 chronic kidney disease, or unspecified chronic kidney disease: Secondary | ICD-10-CM | POA: Diagnosis not present

## 2019-01-02 DIAGNOSIS — N189 Chronic kidney disease, unspecified: Secondary | ICD-10-CM | POA: Diagnosis not present

## 2019-01-02 DIAGNOSIS — M79602 Pain in left arm: Secondary | ICD-10-CM | POA: Diagnosis present

## 2019-01-02 DIAGNOSIS — Z79899 Other long term (current) drug therapy: Secondary | ICD-10-CM | POA: Insufficient documentation

## 2019-01-02 NOTE — ED Triage Notes (Signed)
Pt states she injured her L. Arm when she went through a drive-thru earlier in day and they handed her a drink holder with 4 lg drinks in it and she immediately felt a pop and pain from inside elbow up to neck. States she cannot turn her hand back and forth or straighten out her arm. Pt also c/o nausea from pain.

## 2019-01-03 ENCOUNTER — Emergency Department (HOSPITAL_COMMUNITY): Payer: 59

## 2019-01-03 ENCOUNTER — Emergency Department (HOSPITAL_COMMUNITY)
Admission: EM | Admit: 2019-01-03 | Discharge: 2019-01-03 | Disposition: A | Discharge status: Home / Self Care | Payer: 59 | Attending: Emergency Medicine | Admitting: Emergency Medicine

## 2019-01-03 DIAGNOSIS — S46202A Unspecified injury of muscle, fascia and tendon of other parts of biceps, left arm, initial encounter: Secondary | ICD-10-CM

## 2019-01-03 MED ORDER — LORAZEPAM 0.5 MG PO TABS
0.5000 mg | ORAL_TABLET | Freq: Once | ORAL | Status: AC
  Administered 2019-01-03: 03:00:00 0.5 mg via ORAL
  Filled 2019-01-03: qty 1

## 2019-01-03 MED ORDER — OXYCODONE-ACETAMINOPHEN 5-325 MG PO TABS
1.0000 | ORAL_TABLET | Freq: Once | ORAL | Status: AC
  Administered 2019-01-03: 1 via ORAL
  Filled 2019-01-03: qty 1

## 2019-01-03 NOTE — ED Notes (Signed)
Sling applied to patient's left forearm.

## 2019-01-03 NOTE — Discharge Instructions (Signed)
Your x-ray and CT scan shows no fracture.  As we discussed there could be some damage to the ligaments, muscle or tendon of your elbow.  Wear the sling as prescribed and follow-up with Dr. Romeo Apple in the office.  Take your pain medication as instructed and use your anti-inflammatories as instructed.  Return to the ED with weakness, numbness, tingling or other concerns.

## 2019-01-03 NOTE — ED Notes (Signed)
Patient transported to X-ray 

## 2019-01-03 NOTE — ED Provider Notes (Signed)
[facility] EMERGENCY DEPARTMENT Provider Note   CSN: [id number] Arrival date & time: 01/02/19  2240    History   Chief Complaint Chief Complaint  Patient presents with   Arm Injury    HPI Johnnette LitterLaura M Laine is a 53 y.o. female.     Patient with history of rheumatoid arthritis on prednisone, fibromyalgia, anxiety presenting with pain in her left arm and elbow.  States she was getting some drinks through the drive-through with her left arm extended against the windowsill of her car.  She states heavy drinks were placed in her arm without her expecting it and she believes her left elbow was hyperextended.  This happened around 2:30 PM.  She felt a pop in her left elbow and now has severe pain radiating up to her shoulder and down to her hand.  She feels nauseated because of the pain.  She took some Tylenol at home without relief.  Does take hydrocodone on a regular basis but did not take any today.  She has pain when she turns her head and pain that radiates down the entire arm.  She feels weak in the arm due to pain.  She denies any numbness or tingling.  Denies any chest pain or shortness of breath.  The history is provided by the patient.  Arm Injury  Associated symptoms: neck pain   Associated symptoms: no fever     Past Medical History:  Diagnosis Date   ADD (attention deficit disorder)    Anxiety    Anxiety    Arthritis    Chronic kidney disease    right nephrectomy   Chronic sinus infection    Fibromyalgia    GERD (gastroesophageal reflux disease)    History of blood clots    Hypertension    IBS (irritable bowel syndrome)    Panic attacks    Pulmonary embolism (HCC)    occured after nephrectomy; was on blood thinners for about 6 months post nephrectomy.   Rheumatoid arthritis (HCC)    Ulcerative colitis (HCC)    Vertigo     Patient Active Problem List   Diagnosis Date Noted   Rheumatoid arthritis (HCC) 05/16/2017   Ulcerative colitis (HCC)  10/07/2014    Past Surgical History:  Procedure Laterality Date   ABDOMINAL HYSTERECTOMY     BIOPSY  11/23/2014   Procedure: BIOPSY;  Surgeon: West BaliSandi L Fields, MD;  Location: [facility];  Service: Endoscopy;;  Right Colon, Left Colon, Rectal   COLONOSCOPY WITH PROPOFOL N/A 11/23/2014   SLF: 1. Normal Ileum 2. Left colon is redundant 3. Moderate proctitis.    DIAGNOSTIC LAPAROSCOPY     KNEE ARTHROSCOPY Bilateral    NEPHRECTOMY  2008   right side   TONSILLECTOMY       OB History   No obstetric history on file.      Home Medications    Prior to Admission medications   Medication Sig Start Date End Date Taking? Authorizing Provider  acetaminophen (TYLENOL) 650 MG CR tablet Take 1,300 mg by mouth daily as needed for pain.    Yes [provider]  amitriptyline (ELAVIL) 50 MG tablet Take 50 mg by mouth at bedtime.   Yes [provider]  amphetamine-dextroamphetamine (ADDERALL) 10 MG tablet Take 1 tablet (10 mg total) by mouth daily with breakfast. 10/14/18  Yes York SpanielWillis, Charles K, MD  buPROPion (WELLBUTRIN XL) 150 MG 24 hr tablet  10/13/18  Yes [provider]  citalopram (CELEXA) 40 MG tablet Take  20 mg by mouth at bedtime.    Yes [provider]  dicyclomine (BENTYL) 10 MG capsule TAKE 1 CAPSULE (10 MG TOTAL) BY MOUTH 4 (FOUR) TIMES DAILY AS NEEDED. 05/12/18  Yes Gelene Mink, NP  fluticasone (FLONASE) 50 MCG/ACT nasal spray Place 2 sprays into the nose daily.   Yes [provider]  HYDROcodone-acetaminophen (NORCO) 10-325 MG tablet Take 1 tablet by mouth every 6 (six) hours as needed. 2-3 times daily   Yes [provider]  loratadine (CLARITIN REDITABS) 10 MG dissolvable tablet Take 10 mg by mouth every morning.   Yes [provider]  LORazepam (ATIVAN) 0.5 MG tablet Take 0.5 mg by mouth 3 (three) times daily as needed for anxiety.    Yes [provider]  NON FORMULARY Cortizone injections tri monthly   In hip joints    Yes [provider]  predniSONE (DELTASONE) 10 MG tablet Take 10 mg by mouth daily with breakfast.   Yes [provider]  predniSONE (DELTASONE) 2.5 MG tablet Take 2.5 mg by mouth daily with breakfast.   Yes [provider]  tiZANidine (ZANAFLEX) 4 MG tablet Take 4 mg by mouth at bedtime.   Yes [provider]  atenolol (TENORMIN) 25 MG tablet Take 50 mg by mouth 2 (two) times daily. Pt has been out of this medication for 2 weeks    [provider]    Family History Family History  Problem Relation Age of Onset   Colon cancer Paternal Grandfather 71   Ulcerative colitis Cousin    Rheum arthritis Mother    Rheum arthritis Paternal Grandmother    Diverticulitis Father    Prostate cancer Father    Colon polyps Neg Hx     Social History Social History   Tobacco Use   Smoking status: Never Smoker   Smokeless tobacco: Never Used   Tobacco comment: Never smoked  Substance Use Topics   Alcohol use: No    Alcohol/week: 0.0 standard drinks   Drug use: No     Allergies   Other; Penicillins; and Adhesive [tape]   Review of Systems Review of Systems  Constitutional: Negative for activity change and fever.  HENT: Negative for congestion and rhinorrhea.   Eyes: Negative for visual disturbance.  Respiratory: Negative for cough and shortness of breath.   Cardiovascular: Negative for chest pain.  Gastrointestinal: Negative for abdominal pain, nausea and vomiting.  Genitourinary: Negative for dysuria and hematuria.  Musculoskeletal: Positive for arthralgias, myalgias and neck pain.  Skin: Negative for rash.  Neurological: Negative for dizziness, weakness and headaches.    all other systems are negative except as noted in the HPI and PMH.   Physical Exam Updated Vital Signs BP (!) 137/91 (BP Location: Right Arm)    Pulse (!) 118    Temp 98.4 F (36.9 C) (Oral)    Resp 18    Ht 5\' 5"  (1.651 m)    Wt 103.9 kg    SpO2 95%     BMI 38.11 kg/m   Physical Exam Vitals signs and nursing note reviewed.  Constitutional:      General: She is not in acute distress.    Appearance: She is well-developed. She is obese.  HENT:     Head: Normocephalic and atraumatic.     Mouth/Throat:     Pharynx: No oropharyngeal exudate.  Eyes:     Conjunctiva/sclera: Conjunctivae normal.     Pupils: Pupils are equal, round, and reactive to light.  Neck:     Musculoskeletal: Normal range of motion and neck supple.     Comments: No meningismus. Cardiovascular:     Rate and Rhythm: Normal rate and regular rhythm.     Heart sounds: Normal heart sounds. No murmur.  Pulmonary:     Effort: Pulmonary effort is normal. No respiratory distress.     Breath sounds: Normal breath sounds.  Abdominal:     Palpations: Abdomen is soft.     Tenderness: There is no abdominal tenderness. There is no guarding or rebound.  Musculoskeletal: Normal range of motion.        General: Swelling, tenderness and signs of injury present.     Comments: L arm held in flexed position at elbow.  Patient with pain to palpation along biceps tendon without obvious step-off..  There is no bony tenderness.  She does not want to extend her arm at the elbow.  Intact radial pulse and cardinal hand movements. No paraspinal C-spine tenderness, no pain with range of motion of the shoulder.  Skin:    General: Skin is warm.     Capillary Refill: Capillary refill takes less than 2 seconds.  Neurological:     General: No focal deficit present.     Mental Status: She is alert and oriented to person, place, and time. Mental status is at baseline.     Cranial Nerves: No cranial nerve deficit.     Motor: No abnormal muscle tone.     Coordination: Coordination normal.     Comments: No ataxia on finger to nose bilaterally. No pronator drift. 5/5 strength throughout. CN 2-12 intact.Equal grip strength. Sensation intact.   Psychiatric:        Behavior: Behavior normal.       ED Treatments / Results  Labs (all labs ordered are listed, but only abnormal results are displayed) Labs Reviewed - No data to display  EKG None  Radiology Dg Cervical Spine Complete  Result Date: 01/03/2019 CLINICAL DATA:  53 y/o  F; felt a pop with left elbow pain. EXAM: CERVICAL SPINE - COMPLETE 4+ VIEW COMPARISON:  None. FINDINGS: There is no evidence of cervical spine fracture or prevertebral soft tissue swelling. Alignment is normal. Mild loss of intervertebral disc space height at C3-4. IMPRESSION: No acute fracture or dislocation identified. Mild C3-4 discogenic degenerative changes. Electronically Signed   By: Mitzi HansenLance  Furusawa-Stratton M.D.   On: 01/03/2019 03:30   Ct Elbow Left Wo Contrast  Result Date: 01/03/2019 CLINICAL DATA:  Possible hyperextension injury with elbow pain EXAM: CT OF THE UPPER LEFT EXTREMITY WITHOUT CONTRAST TECHNIQUE: Multidetector CT imaging of the upper left extremity was performed according to the standard protocol. COMPARISON:  None. FINDINGS: Bones/Joint/Cartilage No evidence of acute fracture or dislocation is noted. Ligaments Suboptimally assessed by CT. Muscles and Tendons Muscle bellies surrounding the elbow appear within normal limits. No definitive tendon rupture is identified. No inflammatory changes are seen. Soft tissues Surrounding soft tissues show no acute abnormality. No joint effusion is seen. IMPRESSION: No definitive acute abnormality noted. Nonemergent MRI may be helpful as clinically indicated. Electronically Signed   By: Alcide CleverMark  Lukens M.D.   On: 01/03/2019 03:36   Dg Humerus Left  Result Date: 01/03/2019 CLINICAL DATA:  53 y/o  F; felt a pop on inside of elbow with pain. EXAM: LEFT HUMERUS - 2+ VIEW COMPARISON:  None. FINDINGS: There is no evidence of fracture or other focal bone lesions. Soft tissues are unremarkable. IMPRESSION: No acute fracture or dislocation  identified. Electronically Signed   By: Mitzi HansenLance  Furusawa-Stratton M.D.    On: 01/03/2019 03:28    Procedures Procedures (including critical care time)  Medications Ordered in ED Medications  oxyCODONE-acetaminophen (PERCOCET/ROXICET) 5-325 MG per tablet 1 tablet (has no administration in time range)  LORazepam (ATIVAN) tablet 0.5 mg (has no administration in time range)     Initial Impression / Assessment and Plan / ED Course  I have reviewed the triage vital signs and the nursing notes.  Pertinent labs & imaging results that were available during my care of the patient were reviewed by me and considered in my medical decision making (see chart for details).       Pop in left elbow after hyperextension injury.  Intact pulse and cardinal hand movements.  Reduced range of motion secondary to pain.  No bony tenderness.  Concern for possible bicep tendon or muscle rupture as well as possible ligament injury.  Imaging is negative for fracture or dislocation. Tendons appear to be normal on CT scan but this is not ideal.  Patient's range of motion is somewhat improved after receiving pain medication and muscle relaxers.  She is neurovascularly intact.  She is reassured that there is no fracture or dislocation.  Advised wearing a sling and follow-up with orthopedics for an MRI to further assess for possible tendon or ligament damage.  Discussed she could have a partial muscle or tendon or ligament tear.  She has pain medication at home and takes anti-inflammatories on a regular basis.  Return precautions discussed.  Final Clinical Impressions(s) / ED Diagnoses   Final diagnoses:  Unspecified injury of muscle, fascia and tendon of other parts of biceps, left arm, initial encounter    ED Discharge Orders    None       Anacristina Steffek, Jeannett SeniorStephen, MD 01/03/19 [phone]

## 2019-04-15 NOTE — Progress Notes (Deleted)
PATIENT: Darlene Schwartz DOB: 09-Oct-1965  REASON FOR VISIT: follow up HISTORY FROM: patient  HISTORY OF PRESENT ILLNESS: Today 04/15/19  Darlene Schwartz is a 53 year old female with history of obesity, attention deficit disorder, fibromyalgia, ulcerative colitis, rheumatoid arthritis, and headache.  She had MRI of the brain in March 2020 given her report of memory issues, numbness, headache, and vertigo.  She was started on Adderall 10 mg daily.  HISTORY 10/14/2018 Dr. Anne HahnWillis: Darlene Schwartz is a [age] year old left-handed white female with a history of obesity, attention deficit disorder, fibromyalgia, ulcerative colitis, rheumatoid arthritis, and headache.  The patient comes in today with a multitude of somatic complaints.  She reports that she has diffuse pain with trigger points that are present on the arms and shoulders and back and hips.  She has had a 6-year history of some vertigo problems that may be worse at times than others.  She reports a "brain fog" and difficulty with focusing associated with her fibromyalgia.  She has had a longstanding history of headaches that have worsened some since a fall and a mild concussion that occurred in 2019.  The patient has had some problems with slurred speech, short-term memory problems, misplacing things about the house.  She has numbness in the face and tongue and lips that comes and goes.  The patient has some numbness in the arms and feet and in the right groin area as well.  She has some problems with gait instability, she may stumble on occasion.  She may have a sensation of water running down the legs at times.  She has reported decreased vision, decreased hearing on the right, anxiety and depression, fatigue, insomnia that is chronic in nature.  She has joint pains, and problems with sweats and chills.  She claims that her headache is mainly in the back of the head, it is usually mild but they have been more severe and is daily in nature associated with some  photophobia and phonophobia, occasional nausea.  She claims her mother also has headaches.  She finds that she has some troubles with shortness of breath and wheezing and occasional difficulty swallowing.  She is being followed through rheumatologist who just recently has done extensive blood work, she does not know what was drawn or what the results were.  In the past, gabapentin has increased her problems with focusing and memory.  She has not tried Lyrica previously.  She is on amitriptyline, she is still not sleeping on 50 mg at night, she claims that her doctor is planning on increasing the dose to 75 mg.   REVIEW OF SYSTEMS: Out of a complete 14 system review of symptoms, the patient complains only of the following symptoms, and all other reviewed systems are negative.  ALLERGIES: Allergies  Allergen Reactions   Other     Pnemonia vaccine   Penicillins Itching   Adhesive [Tape] Rash    HOME MEDICATIONS: Outpatient Medications Prior to Visit  Medication Sig Dispense Refill   acetaminophen (TYLENOL) 650 MG CR tablet Take 1,300 mg by mouth daily as needed for pain.      amitriptyline (ELAVIL) 50 MG tablet Take 50 mg by mouth at bedtime.     amphetamine-dextroamphetamine (ADDERALL) 10 MG tablet Take 1 tablet (10 mg total) by mouth daily with breakfast. 30 tablet 0   atenolol (TENORMIN) 25 MG tablet Take 50 mg by mouth 2 (two) times daily. Pt has been out of this medication for 2 weeks  buPROPion (WELLBUTRIN XL) 150 MG 24 hr tablet      citalopram (CELEXA) 40 MG tablet Take 20 mg by mouth at bedtime.      dicyclomine (BENTYL) 10 MG capsule TAKE 1 CAPSULE (10 MG TOTAL) BY MOUTH 4 (FOUR) TIMES DAILY AS NEEDED. 120 capsule 4   fluticasone (FLONASE) 50 MCG/ACT nasal spray Place 2 sprays into the nose daily.     HYDROcodone-acetaminophen (NORCO) 10-325 MG tablet Take 1 tablet by mouth every 6 (six) hours as needed. 2-3 times daily     loratadine (CLARITIN REDITABS) 10 MG  dissolvable tablet Take 10 mg by mouth every morning.     LORazepam (ATIVAN) 0.5 MG tablet Take 0.5 mg by mouth 3 (three) times daily as needed for anxiety.      NON FORMULARY Cortizone injections tri monthly   In hip joints     predniSONE (DELTASONE) 10 MG tablet Take 10 mg by mouth daily with breakfast.     predniSONE (DELTASONE) 2.5 MG tablet Take 2.5 mg by mouth daily with breakfast.     tiZANidine (ZANAFLEX) 4 MG tablet Take 4 mg by mouth at bedtime.     No facility-administered medications prior to visit.     PAST MEDICAL HISTORY: Past Medical History:  Diagnosis Date   ADD (attention deficit disorder)    Anxiety    Anxiety    Arthritis    Chronic kidney disease    right nephrectomy   Chronic sinus infection    Fibromyalgia    GERD (gastroesophageal reflux disease)    History of blood clots    Hypertension    IBS (irritable bowel syndrome)    Panic attacks    Pulmonary embolism ([place])    occured after nephrectomy; was on blood thinners for about 6 months post nephrectomy.   Rheumatoid arthritis ([place])    Ulcerative colitis ([place])    Vertigo     PAST SURGICAL HISTORY: Past Surgical History:  Procedure Laterality Date   ABDOMINAL HYSTERECTOMY     BIOPSY  11/23/2014   Procedure: BIOPSY;  Schwartz: Danie Binder, MD;  Location: [facility];  Service: Endoscopy;;  Right Colon, Left Colon, Rectal   COLONOSCOPY WITH PROPOFOL N/A 11/23/2014   SLF: 1. Normal Ileum 2. Left colon is redundant 3. Moderate proctitis.    DIAGNOSTIC LAPAROSCOPY     KNEE ARTHROSCOPY Bilateral    NEPHRECTOMY  2008   right side   TONSILLECTOMY      FAMILY HISTORY: Family History  Problem Relation Age of Onset   Colon cancer Paternal Grandfather 6   Ulcerative colitis Cousin    Rheum arthritis Mother    Rheum arthritis Paternal Grandmother    Diverticulitis Father    Prostate cancer Father    Colon polyps Neg Hx     SOCIAL HISTORY: Social History    Socioeconomic History   Marital status: Married    Spouse name: Willaim   Number of children: 5   Years of education: Not on file   Highest education level: Master's degree (e.g., MA, MS, MEng, MEd, MSW, MBA)  Occupational History   Occupation: N/A  Social Designer, fashion/clothing strain: Not on file   Food insecurity    Worry: Not on file    Inability: Not on file   Transportation needs    Medical: Not on file    Non-medical: Not on file  Tobacco Use   Smoking status: Never Smoker   Smokeless tobacco: Never Used  Tobacco comment: Never smoked  Substance and Sexual Activity   Alcohol use: No    Alcohol/week: 0.0 standard drinks   Drug use: No   Sexual activity: Yes    Birth control/protection: Surgical  Lifestyle   Physical activity    Days per week: Not on file    Minutes per session: Not on file   Stress: Not on file  Relationships   Social connections    Talks on phone: Not on file    Gets together: Not on file    Attends religious service: Not on file    Active member of club or organization: Not on file    Attends meetings of clubs or organizations: Not on file    Relationship status: Not on file   Intimate partner violence    Fear of current or ex partner: Not on file    Emotionally abused: Not on file    Physically abused: Not on file    Forced sexual activity: Not on file  Other Topics Concern   Not on file  Social History Narrative   ADOPTED TWIN BOYS: AGE 49. MARRIED-4 YRS(2ND MARRIAGE). 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   Left handed    Caffeine 1-2 cups daily    Lives at home with spouse and kids       PHYSICAL EXAM  There were no vitals filed for this visit. There is no height or weight on file to calculate BMI.  Generalized: Well developed, in no acute distress   Neurological examination  Mentation: Alert oriented to time, place, history taking.  Follows all commands speech and language fluent Cranial nerve II-XII: Pupils were equal round reactive to light. Extraocular movements were full, visual field were full on confrontational test. Facial sensation and strength were normal. Uvula tongue midline. Head turning and shoulder shrug  were normal and symmetric. Motor: The motor testing reveals 5 over 5 strength of all 4 extremities. Good symmetric motor tone is noted throughout.  Sensory: Sensory testing is intact to soft touch on all 4 extremities. No evidence of extinction is noted.  Coordination: Cerebellar testing reveals good finger-nose-finger and heel-to-shin bilaterally.  Gait and station: Gait is normal. Tandem gait is normal. Romberg is negative. No drift is seen.  Reflexes: Deep tendon reflexes are symmetric and normal bilaterally.   DIAGNOSTIC DATA (LABS, IMAGING, TESTING) - I reviewed patient records, labs, notes, testing and imaging myself where available.  Lab Results  Component Value Date   WBC 9.6 11/19/2014   HGB 13.5 11/19/2014   HCT 41.4 11/19/2014   MCV 95.4 11/19/2014   PLT 290 11/19/2014      Component Value Date/Time   NA 141 11/19/2014 1120   K 4.7 11/19/2014 1120   CL 106 11/19/2014 1120   CO2 27 11/19/2014 1120   GLUCOSE 123 (H) 11/19/2014 1120   BUN 19 11/19/2014 1120   CREATININE 1.06 11/19/2014 1120   CALCIUM 9.1 11/19/2014 1120   PROT 7.1 01/13/2013 1708   ALBUMIN 3.9 01/13/2013 1708   AST 19 01/13/2013 1708   ALT 19 01/13/2013 1708   ALKPHOS 88 01/13/2013 1708   BILITOT 0.6 01/13/2013 1708   GFRNONAA 61 (L) 11/19/2014 1120   GFRAA 71 (L) 11/19/2014 1120   No results found for: CHOL, HDL, LDLCALC, LDLDIRECT, TRIG, CHOLHDL No results found for: CNOB0J No results found for: VITAMINB12 No results found for: TSH    ASSESSMENT AND PLAN 53 y.o. year old  female  has a past medical history of ADD (attention deficit disorder), Anxiety, Anxiety, Arthritis, Chronic kidney disease, Chronic sinus  infection, Fibromyalgia, GERD (gastroesophageal reflux disease), History of blood clots, Hypertension, IBS (irritable bowel syndrome), Panic attacks, Pulmonary embolism (H[facility]), Rheumatoid arthritis (HCC), Ulcerative colitis (HCC), and Vertigo. here with ***   I spent 15 minutes with the patient. 50% of this time was spent   Margie Ege, [place], DNP 04/15/2019, 7:45 PM [facility] [address], [address] [place], [place] [address] [phone]

## 2019-04-16 ENCOUNTER — Ambulatory Visit: Payer: 59 | Admitting: Neurology

## 2019-04-16 ENCOUNTER — Telehealth: Payer: Self-pay

## 2019-04-16 NOTE — Telephone Encounter (Signed)
Patient was a no call/no show for their appointment today.   

## 2019-04-21 ENCOUNTER — Encounter: Payer: Self-pay | Admitting: Neurology

## 2019-08-05 ENCOUNTER — Encounter: Payer: Self-pay | Admitting: Allergy & Immunology

## 2019-08-05 ENCOUNTER — Other Ambulatory Visit: Payer: Self-pay

## 2019-08-05 ENCOUNTER — Ambulatory Visit (INDEPENDENT_AMBULATORY_CARE_PROVIDER_SITE_OTHER): Payer: 59 | Admitting: Allergy & Immunology

## 2019-08-05 VITALS — BP 124/82 | HR 96 | Temp 97.5°F | Resp 18 | Ht 67.0 in | Wt 220.4 lb

## 2019-08-05 DIAGNOSIS — B999 Unspecified infectious disease: Secondary | ICD-10-CM

## 2019-08-05 DIAGNOSIS — K9049 Malabsorption due to intolerance, not elsewhere classified: Secondary | ICD-10-CM

## 2019-08-05 DIAGNOSIS — J310 Chronic rhinitis: Secondary | ICD-10-CM | POA: Diagnosis not present

## 2019-08-05 DIAGNOSIS — J302 Other seasonal allergic rhinitis: Secondary | ICD-10-CM | POA: Diagnosis not present

## 2019-08-05 DIAGNOSIS — J3089 Other allergic rhinitis: Secondary | ICD-10-CM

## 2019-08-05 NOTE — Patient Instructions (Addendum)
1. Chronic rhinitis - Testing today showed: indoor molds, outdoor molds, dust mites and cockroach - Copy of test results provided. - Avoidance measures provided. - Stop taking: Claritin - Continue with: Flonase (fluticasone) two sprays per nostril daily - Start taking: Astelin (azelastine) 2 sprays per nostril 1-2 times daily as needed - You can use an extra dose of the antihistamine, if needed, for breakthrough symptoms.  - Consider nasal saline rinses 1-2 times daily to remove allergens from the nasal cavities as well as help with mucous clearance (this is especially helpful to do before the nasal sprays are given) - Consider allergy shots as a means of long-term control. - Allergy shots "re-train" and "reset" the immune system to ignore environmental allergens and decrease the resulting immune response to those allergens (sneezing, itchy watery eyes, runny nose, nasal congestion, etc).    - Allergy shots improve symptoms in 75-85% of patients.  - We can discuss more at the next appointment if the medications are not working for you.  2. Recurrent infections - We will obtain some screening labs to evaluate your immune system.  - Labs to evaluate the quantitative [facility](HOW MUCH) aspects of your immune system: IgG/IgA/IgM, CBC with differential - Labs to evaluate the qualitative (HOW WELL THEY WORK) aspects of your immune system: AH50 and CH50, Pneumococcal titers, Tetanus titers, Diphtheria titers - We may consider immunizations with Pneumovax and Tdap to challenge your immune system, and then obtain repeat titers in 4-6 weeks.  - We are ordering labs, so please allow 1-2 weeks for the results to come back. - With the newly implemented Cures Act, the labs might be visible to you at the same time that they become visible to me. - However, I will not address the results until all of the results come  back, so please be patient.   3. Food intolerance - Testing today showed: negative to the entire  panel - However, this could still be intolerance rather than an allergy. - There does not seem to be a need for an EpiPen.   4. Return in about 3 months (around 11/03/2019). This can be an in-person, a virtual Webex or a telephone follow up visit.   Please inform us of any Emergency Department visits, hospitalizations, or changes in symptoms. Call us before going to the ED for breathing or allergy symptoms since we might be able to fit you in for a sick visit. Feel free to contact us anytime with any questions, problems, or concerns.  It was a pleasure to meet you today!  Websites that have reliable patient information: 1. American Academy of Asthma, Allergy, and Immunology: www.aaaai.org 2. Food Allergy Research and Education (FARE): foodallergy.org 3. Mothers of Asthmatics: http://www.asthmacommunitynetwork.org 4. American College of Allergy, Asthma, and Immunology: www.acaai.org  "Like" us on Facebook and Instagram for our latest updates!        Make sure you are registered to vote! If you have moved or changed any of your contact information, you will need to get this updated before voting!  In some cases, you MAY be able to register to vote online: [email or web address].ncsbe.gov/[email or web address]-[email or web address]-Vote    Control of Mold Allergen   Mold and fungi can grow on a variety of surfaces provided certain temperature and moisture conditions exist.  Outdoor molds grow on plants, decaying vegetation and soil.  The major outdoor mold, Alternaria and Cladosporium, are found in very high numbers during hot and dry conditions.  Generally, a late Summer - Fall peak is seen  for common outdoor fungal spores.  Rain will temporarily lower outdoor mold spore count, but counts rise rapidly when the rainy period ends.  The most important indoor molds are Aspergillus and Penicillium.  Dark, humid and poorly ventilated basements are ideal sites for mold growth.  The next most common sites of mold growth are the  bathroom and the kitchen.  Outdoor (Seasonal) Mold Control  Positive outdoor molds via skin testing: Bipolaris (Helminthsporium), Drechslera (Curvalaria) and Mucor  1. Use air conditioning and keep windows closed 2. Avoid exposure to decaying vegetation. 3. Avoid leaf raking. 4. Avoid grain handling. 5. Consider wearing a face mask if working in moldy areas.  6.   Indoor (Perennial) Mold Control   Positive indoor molds via skin testing: Aspergillus, Penicillium, Fusarium, Aureobasidium (Pullulara) and Rhizopus  1. Maintain humidity below 50%. 2. Clean washable surfaces with 5% bleach solution. 3. Remove sources e.g. contaminated carpets.     Control of Dust Mite Allergen    Dust mites play a major role in allergic asthma and rhinitis.  They occur in environments with high humidity wherever human skin is found.  Dust mites absorb humidity from the atmosphere (ie, they do not drink) and feed on organic matter (including shed human and animal skin).  Dust mites are a microscopic type of insect that you cannot see with the naked eye.  High levels of dust mites have been detected from mattresses, pillows, carpets, upholstered furniture, bed covers, clothes, soft toys and any woven material.  The principal allergen of the dust mite is found in its feces.  A gram of dust may contain 1,000 mites and 250,000 fecal particles.  Mite antigen is easily measured in the air during house cleaning activities.  Dust mites do not bite and do not cause harm to humans, other than by triggering allergies/asthma.    Ways to decrease your exposure to dust mites in your home:  1. Encase mattresses, box springs and pillows with a mite-impermeable barrier or cover   2. Wash sheets, blankets and drapes weekly in hot water (130 F) with detergent and dry them in a dryer on the hot setting.  3. Have the room cleaned frequently with a vacuum cleaner and a damp dust-mop.  For carpeting or rugs, vacuuming with a  vacuum cleaner equipped with a high-efficiency particulate air (HEPA) filter.  The dust mite allergic individual should not be in a room which is being cleaned and should wait 1 hour after cleaning before going into the room. 4. Do not sleep on upholstered furniture (eg, couches).   5. If possible removing carpeting, upholstered furniture and drapery from the home is ideal.  Horizontal blinds should be eliminated in the rooms where the person spends the most time (bedroom, study, television room).  Washable vinyl, roller-type shades are optimal. 6. Remove all non-washable stuffed toys from the bedroom.  Wash stuffed toys weekly like sheets and blankets above.   7. Reduce indoor humidity to less than 50%.  Inexpensive humidity monitors can be purchased at most hardware stores.  Do not use a humidifier as can make the problem worse and are not recommended.  Control of Cockroach Allergen  Cockroach allergen has been identified as an important cause of acute attacks of asthma, especially in urban settings.  There are fifty-five species of cockroach that exist in the [place], however only three, the [place], [place] species produce allergen that can affect patients with Asthma.  Allergens can be obtained from fecal  particles, egg casings and secretions from cockroaches.    1. Remove food sources. 2. Reduce access to water. 3. Seal access and entry points. 4. Spray runways with 0.5-1% Diazinon or Chlorpyrifos 5. Blow boric acid power under stoves and refrigerator. 6. Place bait stations (hydramethylnon) at feeding sites.  Allergy Shots   Allergies are the result of a chain reaction that starts in the immune system. Your immune system controls how your body defends itself. For instance, if you have an allergy to pollen, your immune system identifies pollen as an invader or allergen. Your immune system overreacts by producing antibodies called Immunoglobulin E (IgE). These antibodies  travel to cells that release chemicals, causing an allergic reaction.  The concept behind allergy immunotherapy, whether it is received in the form of shots or tablets, is that the immune system can be desensitized to specific allergens that trigger allergy symptoms. Although it requires time and patience, the payback can be long-term relief.  How Do Allergy Shots Work?  Allergy shots work much like a vaccine. Your body responds to injected amounts of a particular allergen given in increasing doses, eventually developing a resistance and tolerance to it. Allergy shots can lead to decreased, minimal or no allergy symptoms.  There generally are two phases: build-up and maintenance. Build-up often ranges from three to six months and involves receiving injections with increasing amounts of the allergens. The shots are typically given once or twice a week, though more rapid build-up schedules are sometimes used.  The maintenance phase begins when the most effective dose is reached. This dose is different for each person, depending on how allergic you are and your response to the build-up injections. Once the maintenance dose is reached, there are longer periods between injections, typically two to four weeks.  Occasionally doctors give cortisone-type shots that can temporarily reduce allergy symptoms. These types of shots are different and should not be confused with allergy immunotherapy shots.  Who Can Be Treated with Allergy Shots?  Allergy shots may be a good treatment approach for people with allergic rhinitis (hay fever), allergic asthma, conjunctivitis (eye allergy) or stinging insect allergy.   Before deciding to begin allergy shots, you should consider:  . The length of allergy season and the severity of your symptoms . Whether medications and/or changes to your environment can control your symptoms . Your desire to avoid long-term medication use . Time: allergy immunotherapy requires a  major time commitment . Cost: may vary depending on your insurance coverage  Allergy shots for children age 50 and older are effective and often well tolerated. They might prevent the onset of new allergen sensitivities or the progression to asthma.  Allergy shots are not started on patients who are pregnant but can be continued on patients who become pregnant while receiving them. In some patients with other medical conditions or who take certain common medications, allergy shots may be of risk. It is important to mention other medications you talk to your allergist.   When Will I Feel Better?  Some may experience decreased allergy symptoms during the build-up phase. For others, it may take as long as 12 months on the maintenance dose. If there is no improvement after a year of maintenance, your allergist will discuss other treatment options with you.  If you aren't responding to allergy shots, it may be because there is not enough dose of the allergen in your vaccine or there are missing allergens that were not identified during your allergy testing. Other  reasons could be that there are high levels of the allergen in your environment or major exposure to non-allergic triggers like tobacco smoke.  What Is the Length of Treatment?  Once the maintenance dose is reached, allergy shots are generally continued for three to five years. The decision to stop should be discussed with your allergist at that time. Some people may experience a permanent reduction of allergy symptoms. Others may relapse and a longer course of allergy shots can be considered.  What Are the Possible Reactions?  The two types of adverse reactions that can occur with allergy shots are local and systemic. Common local reactions include very mild redness and swelling at the injection site, which can happen immediately or several hours after. A systemic reaction, which is less common, affects the entire body or a particular body  system. They are usually mild and typically respond quickly to medications. Signs include increased allergy symptoms such as sneezing, a stuffy nose or hives.  Rarely, a serious systemic reaction called anaphylaxis can develop. Symptoms include swelling in the throat, wheezing, a feeling of tightness in the chest, nausea or dizziness. Most serious systemic reactions develop within 30 minutes of allergy shots. This is why it is strongly recommended you wait in your doctor's office for 30 minutes after your injections. Your allergist is trained to watch for reactions, and his or her staff is trained and equipped with the proper medications to identify and treat them.  Who Should Administer Allergy Shots?  The preferred location for receiving shots is your prescribing allergist's office. Injections can sometimes be given at another facility where the physician and staff are trained to recognize and treat reactions, and have received instructions by your prescribing allergist.

## 2019-08-05 NOTE — Progress Notes (Signed)
NEW PATIENT  Date of Service/Encounter:  08/05/19  Referring provider: Benita Stabile, MD   Assessment:   Perennial and seasonal allergic rhinitis (indoor molds, outdoor molds, dust mites and cockroach)  Recurrent infections - obtaining immune screen today  Food intolerance - with multiple negatives today  Complicated past medical history, including fibromyalgia   Plan/Recommendations:   1. Chronic rhinitis - Testing today showed: indoor molds, outdoor molds, dust mites and cockroach  - Copy of test results provided. - Avoidance measures provided. - Stop taking: Claritin - Continue with: Flonase (fluticasone) two sprays per nostril daily - Start taking: Astelin (azelastine) 2 sprays per nostril 1-2 times daily as needed - You can use an extra dose of the antihistamine, if needed, for breakthrough symptoms.  - Consider nasal saline rinses 1-2 times daily to remove allergens from the nasal cavities as well as help with mucous clearance (this is especially helpful to do before the nasal sprays are given) - Consider allergy shots as a means of long-term control. - Allergy shots "re-train" and "reset" the immune system to ignore environmental allergens and decrease the resulting immune response to those allergens (sneezing, itchy watery eyes, runny nose, nasal congestion, etc).    - Allergy shots improve symptoms in 75-85% of patients.  - We can discuss more at the next appointment if the medications are not working for you.  2. Recurrent infections - We will obtain some screening labs to evaluate your immune system.  - Labs to evaluate the quantitative [facility]) aspects of your immune system: IgG/IgA/IgM, CBC with differential - Labs to evaluate the qualitative (HOW WELL THEY WORK) aspects of your immune system: AH50 and CH50, Pneumococcal titers, Tetanus titers, Diphtheria titers - We may consider immunizations with Pneumovax and Tdap to challenge your immune system, and then  obtain repeat titers in 4-6 weeks.  - We are ordering labs, so please allow 1-2 weeks for the results to come back. - With the newly implemented Cures Act, the labs might be visible to you at the same time that they become visible to me. - However, I will not address the results until all of the results come  back, so please be patient.   3. Food intolerance - Testing today showed: negative to the entire panel - However, this could still be intolerance rather than an allergy. - There does not seem to be a need for an EpiPen.   4. Return in about 3 months (around 11/03/2019). This can be an in-person, a virtual Webex or a telephone follow up visit.   Subjective:   Darlene Schwartz is a 53 y.o. female presenting today for evaluation of  Chief Complaint  Patient presents with  . Sinus Issues    Recurent Sinus Infections   . Allergies    Environmental, Dyes     Darlene Schwartz has a history of the following: Patient Active Problem List   Diagnosis Date Noted  . Seasonal and perennial allergic rhinitis 08/10/2019  . Recurrent infections 08/10/2019  . Food intolerance 08/10/2019  . Rheumatoid arthritis (HCC) 05/16/2017  . Ulcerative colitis (HCC) 10/07/2014    History obtained from: chart review and patient.  Darlene Schwartz was referred by Benita Stabile, MD.     Darlene Schwartz is a 53 y.o. female presenting for an evaluation of a multitude of problems. The history is rather convoluted, as she has a lot to share about her rather complex medical history. She tells me today that she  has "alleriges and sinus infections that are tied to her autoimmune stuff".   Around 53 years of age, she developed severe IBS. This became ulcerative colitis with bloody diarrhea. She has developed arthritis over time as well, although from what I can tell, this is seronegative. There is a strong family history of RA in the family. She has severe muscle pains and fibromyalgia. She sees Dr. Ladonna Snide since 2011. Her PCP is  Dr. Catalina Pizza in [place]. She tells me that there is autoimmunity throughout her whole family, although she has never had any labs to support this from what I can gather.   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 She was a Medical laboratory scientific officer growing up and there is a thought that this was from trauma. She had a hysterectomy and oophorectomy at age 19 from endometriosis. She has developed depression from this which continues to be a problem to this day.   GERD/GI Symptom History: She has seen Dr. Darrick Penna in the past. She had a colonoscopy that showed "distal inflammation". She is now seeing another physician and is set to have another colonoscopy and endoscopy with [facility] early next year.   Asthma/Breathing Symptom History: Perfumes tend to cause an "immediate reaction with a tightening up and discomfort". Certain times in stores, the fragrances and dyes tend to bother her. Cigarette smoke is a big trigger for her, even when she is driving at stop lights. She has never been diagnosed with asthma. She does try to avoid these triggering environments, and as long as she does this her symptoms are kept to a minimum. She has never needed prednisone for these episodes.   Allergic Rhinitis Symptom History: She does have some allergic rhinitis symptoms centered on postnasal drip. She has been on Claritin and Flonase for ten years. These do help but do not resolve the symptoms completely.  She reports a lot of drainage in her throat. She has had more fevers rather than anything else.  Food Allergy Symptom History: She is concerned with food allergies as well. She never has "horrible reactions" to food. Concerning foods include walnuts, pecans, yeast, and a handful of others. She does not have a very good history of this, but she would like some foods tested just the same. She has never required epinephrine and has never been hospitalized for any of these  reactions.   Recurrent Infections Symptom History: She does get sick "all of the time". She reports pneumonia on one occasion and sinus infections as well. She is sick when she goes through the mountains. October is worse for her. She has a lot of environmental triggers. She estimates that she ius on antibiotics 10 times per calendar year. She has had 10-15 this year alone. She did teach school for 22 years. She was sick a lot on the last few years. She reports weakness with heart palpitations and felt "drugged".   Otherwise, there is no history of other atopic diseases, including stinging insect allergies, eczema, urticaria or contact dermatitis. There is no significant infectious history. Vaccinations are up to date.    Past Medical History: Patient Active Problem List   Diagnosis Date Noted  . Seasonal and perennial allergic rhinitis 08/10/2019  . Recurrent infections 08/10/2019  . Food intolerance 08/10/2019  . Rheumatoid arthritis (HCC) 05/16/2017  . Ulcerative colitis (HCC) 10/07/2014    Medication List:  Allergies as of 08/05/2019      Reactions   Other  Pnemonia vaccine   Penicillins Itching   Sulfa Antibiotics    Adhesive [tape] Rash      Medication List       Accurate as of August 05, 2019 11:59 PM. If you have any questions, ask your nurse or doctor.        acetaminophen 650 MG CR tablet Commonly known as: TYLENOL Take 1,300 mg by mouth daily as needed for pain.   amitriptyline 50 MG tablet Commonly known as: ELAVIL Take 50 mg by mouth at bedtime.   amphetamine-dextroamphetamine 10 MG tablet Commonly known as: Adderall Take 1 tablet (10 mg total) by mouth daily with breakfast.   atenolol 25 MG tablet Commonly known as: TENORMIN Take 50 mg by mouth 2 (two) times daily. Pt has been out of this medication for 2 weeks   buPROPion 150 MG 24 hr tablet Commonly known as: WELLBUTRIN XL   citalopram 40 MG tablet Commonly known as: CELEXA Take 20 mg by  mouth at bedtime.   dicyclomine 10 MG capsule Commonly known as: BENTYL TAKE 1 CAPSULE (10 MG TOTAL) BY MOUTH 4 (FOUR) TIMES DAILY AS NEEDED.   fluticasone 50 MCG/ACT nasal spray Commonly known as: FLONASE Place 2 sprays into the nose daily.   HYDROcodone-acetaminophen 10-325 MG tablet Commonly known as: NORCO Take 1 tablet by mouth every 6 (six) hours as needed. 2-3 times daily   loratadine 10 MG dissolvable tablet Commonly known as: CLARITIN REDITABS Take 10 mg by mouth every morning.   LORazepam 0.5 MG tablet Commonly known as: ATIVAN Take 0.5 mg by mouth 3 (three) times daily as needed for anxiety.   NON FORMULARY Cortizone injections tri monthly   In hip joints   predniSONE 10 MG tablet Commonly known as: DELTASONE Take 10 mg by mouth daily with breakfast. What changed: Another medication with the same name was removed. Continue taking this medication, and follow the directions you see here. Changed by: Valentina Shaggy, MD   tiZANidine 4 MG tablet Commonly known as: ZANAFLEX Take 4 mg by mouth at bedtime.       Birth History: non-contributory  Developmental History: non-contributory  Past Surgical History: Past Surgical History:  Procedure Laterality Date  . ABDOMINAL HYSTERECTOMY    . ADENOIDECTOMY    . BIOPSY  11/23/2014   Procedure: BIOPSY;  Surgeon: Danie Binder, MD;  Location: [facility];  Service: Endoscopy;;  Right Colon, Left Colon, Rectal  . COLONOSCOPY WITH PROPOFOL N/A 11/23/2014   SLF: 1. Normal Ileum 2. Left colon is redundant 3. Moderate proctitis.   Marland Kitchen DIAGNOSTIC LAPAROSCOPY    . KNEE ARTHROSCOPY Bilateral   . NEPHRECTOMY  2008   right side  . TONSILLECTOMY       Family History: Family History  Problem Relation Age of Onset  . Colon cancer Paternal Grandfather 65  . Ulcerative colitis Cousin   . Rheum arthritis Mother   . Allergic rhinitis Mother   . Rheum arthritis Paternal Grandmother   . Diverticulitis Father   . Prostate  cancer Father   . Colon polyps Neg Hx   . Angioedema Neg Hx   . Asthma Neg Hx   . Atopy Neg Hx   . Eczema Neg Hx   . Immunodeficiency Neg Hx   . Urticaria Neg Hx      Social History: Haniah lives at home with her family. They live in a house that is around 53 years old. There is tile in the main living areas and wood  in the bedrooms. They have oil and central cooling. There is a dog, cat, and a parrot in the home. She had a bunch of children that she has adopted over the years, many of whom are now living on their own. There are no dust mite coverings on the bedding. There is no tobacco exposure in the home. Aside from taking care of her adopted children, she is also the primary caregiver of her own aging parents.   Review of Systems  Constitutional: Positive for fever. Negative for chills, malaise/fatigue and weight loss.  HENT: Positive for congestion, ear pain, nosebleeds, sinus pain and sore throat. Negative for ear discharge.   Eyes: Negative for blurred vision, double vision, pain, discharge and redness.  Respiratory: Positive for cough. Negative for sputum production, shortness of breath and wheezing.   Cardiovascular: Negative.  Negative for chest pain and palpitations.  Gastrointestinal: Positive for constipation, diarrhea and nausea. Negative for abdominal pain, heartburn and vomiting.  Skin: Negative.  Negative for itching and rash.  Neurological: Negative for dizziness and headaches.  Endo/Heme/Allergies: Positive for environmental allergies. Does not bruise/bleed easily.  Psychiatric/Behavioral: Positive for depression. Negative for substance abuse and suicidal ideas.       Objective:   Blood pressure 124/82, pulse 96, temperature (!) 97.5 F (36.4 C), temperature source Temporal, resp. rate 18, height 5\' 7"  (1.702 m), weight 220 lb 6.4 oz (100 kg), SpO2 95 %. Body mass index is 34.52 kg/m.   Physical Exam:   Physical Exam  Constitutional: She appears  well-developed.  Obese female. Pleasant and very talkative.   HENT:  Head: Normocephalic and atraumatic.  Right Ear: Tympanic membrane, external ear and ear canal normal. No drainage, swelling or tenderness. Tympanic membrane is not injected, not scarred, not erythematous, not retracted and not bulging.  Left Ear: Tympanic membrane, external ear and ear canal normal. No drainage, swelling or tenderness. Tympanic membrane is not injected, not scarred, not erythematous, not retracted and not bulging.  Nose: Mucosal edema and rhinorrhea present. No nasal deformity or septal deviation. No epistaxis. Right sinus exhibits no maxillary sinus tenderness and no frontal sinus tenderness. Left sinus exhibits no maxillary sinus tenderness and no frontal sinus tenderness.  Mouth/Throat: Uvula is midline and oropharynx is clear and moist. Mucous membranes are not pale and not dry.  No polyps present.   Eyes: Pupils are equal, round, and reactive to light. Conjunctivae and EOM are normal. Right eye exhibits no chemosis and no discharge. Left eye exhibits no chemosis and no discharge. Right conjunctiva is not injected. Left conjunctiva is not injected.  Cardiovascular: Normal rate, regular rhythm and normal heart sounds.  Respiratory: Effort normal and breath sounds normal. No accessory muscle usage. No tachypnea. No respiratory distress. She has no wheezes. She has no rhonchi. She has no rales. She exhibits no tenderness.  Moving air well in all lung fields. No increased work of breathing noted.   GI: There is no abdominal tenderness. There is no rebound and no guarding.  Lymphadenopathy:       Head (right side): No submandibular, no tonsillar and no occipital adenopathy present.       Head (left side): No submandibular, no tonsillar and no occipital adenopathy present.    She has no cervical adenopathy.  Neurological: She is alert.  Skin: No abrasion, no petechiae and no rash noted. Rash is not papular, not  vesicular and not urticarial. No erythema. No pallor.  No eczematous or urticarial lesions noted.   Psychiatric:  She has a normal mood and affect.     Di15agnostic studies:    Testing to entire food panel: negative to all with adequate controls   Testing to entire environmental panel (percutaneous): negative with adequate controls  Testing to the environmental panel (intracutaneous): 1+ to MM2, 1+ to MM3, 1+ to MM4, 1+ to CR, 1+ to DM, otherwise negative with adequate controls  Allergy Studies:      Allergy testing results were read and interpreted by myself, documented by clinical staff.         Malachi BondsJoel Elbony Mcclimans, MD [facility]

## 2019-08-08 ENCOUNTER — Encounter: Payer: Self-pay | Admitting: Allergy & Immunology

## 2019-08-10 ENCOUNTER — Telehealth: Payer: Self-pay | Admitting: Allergy & Immunology

## 2019-08-10 ENCOUNTER — Encounter: Payer: Self-pay | Admitting: Allergy & Immunology

## 2019-08-10 DIAGNOSIS — K9049 Malabsorption due to intolerance, not elsewhere classified: Secondary | ICD-10-CM | POA: Insufficient documentation

## 2019-08-10 DIAGNOSIS — J3089 Other allergic rhinitis: Secondary | ICD-10-CM | POA: Insufficient documentation

## 2019-08-10 DIAGNOSIS — J302 Other seasonal allergic rhinitis: Secondary | ICD-10-CM | POA: Insufficient documentation

## 2019-08-10 DIAGNOSIS — B999 Unspecified infectious disease: Secondary | ICD-10-CM | POA: Insufficient documentation

## 2019-08-10 NOTE — Telephone Encounter (Signed)
Please call patient.   What allergy medication is she taking?  Is it normal for her blood pressure to go down that low? Is she on blood pressure medications? Can she retake it?   Is she feeling dizzy or feel like she is about to pass out?  Can she take picture of the rash and send it?

## 2019-08-10 NOTE — Telephone Encounter (Signed)
Attempted to call the patient and was forwarded to voicemail. Left a voicemail asking for the patient to return call to discuss.

## 2019-08-10 NOTE — Telephone Encounter (Signed)
Dr. Maudie Mercury please advise since Dr. Ernst Bowler is out of office today.

## 2019-08-10 NOTE — Telephone Encounter (Signed)
Patient called stating that she was seen on 08/05/2019 with Dr Ernst Bowler in [place]. Patient woke up with whelps on her whole arm from the intradermal testing. Patient also states that her blood pressure dropped to 92/34. Patient stated that in the past she has had delayed reactions.  Patient would like recommendations on what to do. Patient is going to take allergy medication for the whelps. Patient was informed that Dr Ernst Bowler will be back in the office tomorrow.   Please advise.

## 2019-08-10 NOTE — Telephone Encounter (Signed)
Called and spoke with patient and she stated that she is feeling more tired today but no dizziness. She takes Allegra but has not had her BP rechecked but will have it rechecked and will send pictures through MyChart of her arm. A link to activate MyChart has been send to the patient's e-mail.

## 2019-08-20 LAB — IGG, IGA, IGM
IgA/Immunoglobulin A, Serum: 169 mg/dL (ref 87–352)
IgM (Immunoglobulin M), Srm: 63 mg/dL (ref 26–217)

## 2019-08-20 LAB — IGG 1, 2, 3, AND 4
IgG (Immunoglobin G), Serum: 886 mg/dL (ref 586–1602)
IgG, Subclass 1: 418 mg/dL (ref 248–810)
IgG, Subclass 2: 260 mg/dL (ref 130–555)
IgG, Subclass 3: 53 mg/dL (ref 15–102)
IgG, Subclass 4: 15 mg/dL (ref 2–96)

## 2019-08-20 LAB — ALPHA-GAL PANEL
Alpha Gal IgE*: <0.1 kU/L (ref <0.10)
Beef (Bos spp) IgE: <0.1 kU/L (ref <0.35)
Class Interpretation: 0
Class Interpretation: 0
Class Interpretation: 0
Lamb/Mutton (Ovis spp) IgE: <0.1 kU/L (ref <0.35)
Pork (Sus spp) IgE: <0.1 kU/L (ref <0.35)

## 2019-08-20 LAB — STREP PNEUMONIAE 23 SEROTYPES IGG
Pneumo Ab Type 1*: 0.5 ug/mL — ABNORMAL LOW (ref >1.3)
Pneumo Ab Type 12 (12F)*: <0.1 ug/mL — ABNORMAL LOW (ref >1.3)
Pneumo Ab Type 14*: <0.1 ug/mL — ABNORMAL LOW (ref >1.3)
Pneumo Ab Type 17 (17F)*: 0.8 ug/mL — ABNORMAL LOW (ref >1.3)
Pneumo Ab Type 19 (19F)*: 0.4 ug/mL — ABNORMAL LOW (ref >1.3)
Pneumo Ab Type 2*: 0.5 ug/mL — ABNORMAL LOW (ref >1.3)
Pneumo Ab Type 20*: 0.8 ug/mL — ABNORMAL LOW (ref >1.3)
Pneumo Ab Type 22 (22F)*: <0.1 ug/mL — ABNORMAL LOW (ref >1.3)
Pneumo Ab Type 23 (23F)*: 0.3 ug/mL — ABNORMAL LOW (ref >1.3)
Pneumo Ab Type 26 (6B)*: <0.1 ug/mL — ABNORMAL LOW (ref >1.3)
Pneumo Ab Type 3*: 1.5 ug/mL (ref >1.3)
Pneumo Ab Type 34 (10A)*: 0.1 ug/mL — ABNORMAL LOW (ref >1.3)
Pneumo Ab Type 4*: <0.1 ug/mL — ABNORMAL LOW (ref >1.3)
Pneumo Ab Type 43 (11A)*: 0.3 ug/mL — ABNORMAL LOW (ref >1.3)
Pneumo Ab Type 5*: <0.1 ug/mL — ABNORMAL LOW (ref >1.3)
Pneumo Ab Type 51 (7F)*: <0.1 ug/mL — ABNORMAL LOW (ref >1.3)
Pneumo Ab Type 54 (15B)*: 2.6 ug/mL (ref >1.3)
Pneumo Ab Type 56 (18C)*: 0.5 ug/mL — ABNORMAL LOW (ref >1.3)
Pneumo Ab Type 57 (19A)*: 1.1 ug/mL — ABNORMAL LOW (ref >1.3)
Pneumo Ab Type 68 (9V)*: <0.1 ug/mL — ABNORMAL LOW (ref >1.3)
Pneumo Ab Type 70 (33F)*: 0.1 ug/mL — ABNORMAL LOW (ref >1.3)
Pneumo Ab Type 8*: 0.5 ug/mL — ABNORMAL LOW (ref >1.3)
Pneumo Ab Type 9 (9N)*: <0.1 ug/mL — ABNORMAL LOW (ref >1.3)

## 2019-08-20 LAB — IGE+ALLERGENS ZONE 2(30)

## 2019-08-20 LAB — CBC WITH DIFFERENTIAL
Basophils Absolute: 0.1 10*3/uL (ref 0.0–0.2)
Basos: 1 %
EOS (ABSOLUTE): 0.2 10*3/uL (ref 0.0–0.4)
Eos: 2 %
Hematocrit: 44 % (ref 34.0–46.6)
Hemoglobin: 14.7 g/dL (ref 11.1–15.9)
Immature Grans (Abs): 0.1 10*3/uL (ref 0.0–0.1)
Immature Granulocytes: 1 %
Lymphocytes Absolute: 3.7 10*3/uL — ABNORMAL HIGH (ref 0.7–3.1)
Lymphs: 31 %
MCH: 31.4 pg (ref 26.6–33.0)
MCHC: 33.4 g/dL (ref 31.5–35.7)
MCV: 94 fL (ref 79–97)
Monocytes Absolute: 0.6 10*3/uL (ref 0.1–0.9)
Monocytes: 5 %
Neutrophils Absolute: 7.4 10*3/uL — ABNORMAL HIGH (ref 1.4–7.0)
Neutrophils: 60 %
RBC: 4.68 x10E6/uL (ref 3.77–5.28)
RDW: 12.4 % (ref 11.7–15.4)
WBC: 11.9 10*3/uL — ABNORMAL HIGH (ref 3.4–10.8)

## 2019-08-20 LAB — ALLERGEN PROFILE, MOLD
Aureobasidi Pullulans IgE: <0.1 kU/L
Candida Albicans IgE: <0.1 kU/L
M009-IgE Fusarium proliferatum: <0.1 kU/L
M014-IgE Epicoccum purpur: <0.1 kU/L
Phoma Betae IgE: <0.1 kU/L
Setomelanomma Rostrat: <0.1 kU/L

## 2019-08-20 LAB — DIPHTHERIA / TETANUS ANTIBODY PANEL
Diphtheria Ab: 1.38 IU/mL (ref <0.10)
Tetanus Ab, IgG: 0.98 IU/mL (ref <0.10)

## 2019-08-20 LAB — ALTERNATIVE PATHWAY (AH50): Alternative Pathway (AH50): 177 Units/mL — ABNORMAL HIGH (ref 77–159)

## 2019-08-20 LAB — COMPLEMENT, TOTAL: Compl, Total (CH50): >60 U/mL (ref >41)

## 2019-08-20 LAB — TRYPTASE: Tryptase: 5.9 ug/L (ref 2.2–13.2)

## 2019-11-03 ENCOUNTER — Ambulatory Visit: Payer: 59 | Admitting: Allergy & Immunology

## 2019-11-17 ENCOUNTER — Ambulatory Visit: Payer: 59 | Admitting: Allergy & Immunology

## 2019-11-18 ENCOUNTER — Other Ambulatory Visit: Payer: Self-pay

## 2019-11-18 ENCOUNTER — Encounter: Payer: Self-pay | Admitting: Allergy & Immunology

## 2019-11-18 ENCOUNTER — Ambulatory Visit (INDEPENDENT_AMBULATORY_CARE_PROVIDER_SITE_OTHER): Payer: 59 | Admitting: Allergy & Immunology

## 2019-11-18 VITALS — BP 104/80 | HR 92 | Temp 98.4°F | Resp 18

## 2019-11-18 DIAGNOSIS — K9049 Malabsorption due to intolerance, not elsewhere classified: Secondary | ICD-10-CM | POA: Diagnosis not present

## 2019-11-18 DIAGNOSIS — J3089 Other allergic rhinitis: Secondary | ICD-10-CM

## 2019-11-18 DIAGNOSIS — J302 Other seasonal allergic rhinitis: Secondary | ICD-10-CM

## 2019-11-18 DIAGNOSIS — B999 Unspecified infectious disease: Secondary | ICD-10-CM

## 2019-11-18 MED ORDER — AZELASTINE HCL 0.1 % NA SOLN: 2.0000 | Freq: Two times a day (BID) | NASAL | 5 refills | Status: DC

## 2019-11-18 NOTE — Patient Instructions (Addendum)
1. Chronic rhinitis (indoor molds, outdoor molds, dust mites and cockroach) - Continue with: Allegra 180mg  daily, Flonase (fluticasone) two sprays per nostril daily and Astelin (azelastine) 2 sprays per nostril 1-2 times daily as needed - You can use an extra dose of the antihistamine, if needed, for breakthrough symptoms.  - Consider nasal saline rinses 1-2 times daily to remove allergens from the nasal cavities as well as help with mucous clearance (this is especially helpful to do before the nasal sprays are given) - Consider allergy shots as a means of long-term control.  2. Recurrent infections - Come back on Friday (11/27/19) for the Pneumovax.  - You will need to wait for 30 minutes after the injection.  - We will recheck your Pneumococcal titers one month afterwards.   3. Return in about 3 months (around 02/17/2020). This can be an in-person, a virtual Webex or a telephone follow up visit.  I do not think that you need COVID vaccine testing since you did fine with the first injection.    Please inform 04/19/2020 of any Emergency Department visits, hospitalizations, or changes in symptoms. Call us before going to the ED for breathing or allergy symptoms since we might be able to fit you in for a sick visit. Feel free to contact us anytime with any questions, problems, or concerns.  It was a pleasure to see you again today!  Websites that have reliable patient information: 1. American Academy of Asthma, Allergy, and Immunology: www.aaaai.org 2. Food Allergy Research and Education (FARE): foodallergy.org 3. Mothers of Asthmatics: http://www.asthmacommunitynetwork.org 4. American College of Allergy, Asthma, and Immunology: www.acaai.org   COVID-19 Vaccine Information can be found at: [place] For questions related to vaccine distribution or appointments, please email [email or web address] or call [phone].     "Like" [phone]  on Facebook and Instagram for our latest updates!       HAPPY SPRING!  Make sure you are registered to vote! If you have moved or changed any of your contact information, you will need to get this updated before voting!  In some cases, you MAY be able to register to vote online: [place]

## 2019-11-18 NOTE — Progress Notes (Signed)
FOLLOW UP  Date of Service/Encounter:  11/18/19   Assessment:   Perennial and seasonal allergic rhinitis (indoor molds, outdoor molds, dust mites and cockroach)  Recurrent infections - with inadequate protection to Streptococcus pneumonia  Food intolerance - with multiple negatives on previous testing  Complicated past medical history, including fibromyalgia  Vaccine counseling - OK to receive the COVID19 vaccination  Plan/Recommendations:   1. Chronic rhinitis (indoor molds, outdoor molds, dust mites and cockroach) - Continue with: Allegra 180mg  daily, Flonase (fluticasone) two sprays per nostril daily and Astelin (azelastine) 2 sprays per nostril 1-2 times daily as needed - You can use an extra dose of the antihistamine, if needed, for breakthrough symptoms.  - Consider nasal saline rinses 1-2 times daily to remove allergens from the nasal cavities as well as help with mucous clearance (this is especially helpful to do before the nasal sprays are given) - Consider allergy shots as a means of long-term control.  2. Recurrent infections - Come back on Friday (11/27/19) for the Pneumovax.  - You will need to wait for 30 minutes after the injection.  - We will recheck your Pneumococcal titers one month afterwards.   3. Return in about 3 months (around 02/17/2020). This can be an in-person, a virtual Webex or a telephone follow up visit.   Subjective:   Darlene Schwartz is a 54 y.o. female presenting today for follow up of  Chief Complaint  Patient presents with  . Allergic Rhinitis   . Allergic Reaction    Pneumonia vaccine, allergic reaction to injection sites     Darlene Schwartz has a history of the following: Patient Active Problem List   Diagnosis Date Noted  . Seasonal and perennial allergic rhinitis 08/10/2019  . Recurrent infections 08/10/2019  . Food intolerance 08/10/2019  . Rheumatoid arthritis (HCC) 05/16/2017  . Ulcerative colitis (HCC) 10/07/2014     History obtained from: chart review and patient.  Darlene Schwartz is a 54 y.o. female presenting for a follow up visit. She was last seen as a New Patient in December 2020. At that time, she had testing that was positive to indoor and outdoor molds as well as dust mites and cockroach. We stopped the Claritin and continued with fluticasone. We also added on azelastine two sprays per nostril up to twice daily.  We did an immune work-up because of her history of recurrent rhinosinusitis.  Labs were all normal aside from inadequate response to Streptococcus pneumonia.  We recommended getting a Pneumovax.  She was afraid of some food allergies as well, but all of the testing was negative.  Since last visit, she has done well.  She is going to have her second COVID shot on Friday. She had her first one and had some headache and lethargy after the first shot. She realizes that this is normal for her. She prepared to have a bad weekend.  She agrees that testing is not indicated.  She "stays sick" with the sinus infections. She tells me that she runs low grade fevers.  These are all focused on her nasal passages.  She has not received her Pneumovax yet.  She would like to do it in our office just to be on the safe side.  Unfortunately, we do not have any in the shot room today, but she is willing to come back next week.  She is open to new nasal regimen ideas.  When I asked her about Astelin, she seems confused.  I reviewed her  medications and it seems that we did not even send it in last time.  This was rectified today.  From a food perspective, she continues to avoid certain items.  However, he is no longer worried about having anaphylaxis and needing epinephrine.  Her breathing is under good control as long as she avoids certain triggers.  She also spends a lot of time discussing her home life.  Apparently she and her husband's marriage is on the rocks.  She is recently accepted to get a masters degree in  counseling.  This is a 2-year degree and after that, she is prepared to leave her marriage.  They also adopted 2 teenage boys who are 93.  This will allow time for them to come of age and leave the house as well.  Otherwise, there have been no changes to her past medical history, surgical history, family history, or social history.    Review of Systems  Constitutional: Negative.  Negative for chills, fever, malaise/fatigue and weight loss.  HENT: Negative.  Negative for congestion, ear discharge, ear pain and sore throat.   Eyes: Negative for pain, discharge and redness.  Respiratory: Negative for cough, sputum production, shortness of breath and wheezing.   Cardiovascular: Negative.  Negative for chest pain and palpitations.  Gastrointestinal: Negative for abdominal pain, constipation, diarrhea, heartburn, nausea and vomiting.  Skin: Negative.  Negative for itching and rash.  Neurological: Negative for dizziness and headaches.  Endo/Heme/Allergies: Negative for environmental allergies. Does not bruise/bleed easily.       Objective:   Blood pressure 104/80, pulse 92, temperature 98.4 F (36.9 C), temperature source Temporal, resp. rate 18, SpO2 96 %. There is no height or weight on file to calculate BMI.   Physical Exam:  Physical Exam  Constitutional: She appears well-developed and well-nourished.  Very talkative.   HENT:  Head: Normocephalic and atraumatic.  Right Ear: Tympanic membrane, external ear and ear canal normal.  Left Ear: Tympanic membrane, external ear and ear canal normal.  Nose: Mucosal edema and rhinorrhea present. No nasal deformity or septal deviation. No epistaxis. Right sinus exhibits no maxillary sinus tenderness and no frontal sinus tenderness. Left sinus exhibits no maxillary sinus tenderness and no frontal sinus tenderness.  Mouth/Throat: Uvula is midline and oropharynx is clear and moist. Mucous membranes are not pale and not dry.  Turbinates enlarged  bilaterally.  Eyes: Pupils are equal, round, and reactive to light. Conjunctivae and EOM are normal. Right eye exhibits no chemosis and no discharge. Left eye exhibits no chemosis and no discharge. Right conjunctiva is not injected. Left conjunctiva is not injected.  Cardiovascular: Normal rate, regular rhythm and normal heart sounds.  Respiratory: Effort normal and breath sounds normal. No accessory muscle usage. No tachypnea. No respiratory distress. She has no wheezes. She has no rhonchi. She has no rales. She exhibits no tenderness.  Moving air well in all lung well in all lung fields.   Lymphadenopathy:    She has no cervical adenopathy.  Neurological: She is alert.  Skin: No abrasion, no petechiae and no rash noted. Rash is not papular, not vesicular and not urticarial. No erythema. No pallor.  No eczematous lesions noted.   Psychiatric: She has a normal mood and affect.     Diagnostic studies: none       Salvatore Marvel, MD  [facility]

## 2019-11-19 ENCOUNTER — Encounter: Payer: Self-pay | Admitting: Allergy & Immunology

## 2019-11-27 ENCOUNTER — Other Ambulatory Visit: Payer: Self-pay

## 2019-11-27 ENCOUNTER — Ambulatory Visit (INDEPENDENT_AMBULATORY_CARE_PROVIDER_SITE_OTHER): Payer: 59

## 2019-11-27 DIAGNOSIS — Z23 Encounter for immunization: Secondary | ICD-10-CM

## 2019-11-27 DIAGNOSIS — B999 Unspecified infectious disease: Secondary | ICD-10-CM

## 2019-11-27 NOTE — Progress Notes (Signed)
Patient reported swelling, heat and soreness with the last pneumococcal vaccine that she received. She also had a fever following the injection. That was some time ago. I spoke with Thurston Hole, FNP and patient waited 1 hour in office post injection. At the end of 1 hour patient's arm was checked and she had no redness, heat or swelling of the arm.

## 2019-12-08 ENCOUNTER — Encounter: Payer: Self-pay | Admitting: Allergy & Immunology

## 2019-12-09 ENCOUNTER — Other Ambulatory Visit: Payer: Self-pay | Admitting: *Deleted

## 2019-12-09 MED ORDER — OLOPATADINE HCL 0.6 % NA SOLN: NASAL | 5 refills | Status: DC

## 2020-02-23 ENCOUNTER — Emergency Department (HOSPITAL_COMMUNITY)
Admission: EM | Admit: 2020-02-23 | Discharge: 2020-02-23 | Disposition: A | Discharge status: Home / Self Care | Payer: BLUE CROSS/BLUE SHIELD | Attending: Emergency Medicine | Admitting: Emergency Medicine

## 2020-02-23 ENCOUNTER — Other Ambulatory Visit: Payer: Self-pay

## 2020-02-23 ENCOUNTER — Emergency Department (HOSPITAL_COMMUNITY): Payer: BLUE CROSS/BLUE SHIELD

## 2020-02-23 ENCOUNTER — Encounter (HOSPITAL_COMMUNITY): Payer: Self-pay | Admitting: Emergency Medicine

## 2020-02-23 DIAGNOSIS — R519 Headache, unspecified: Secondary | ICD-10-CM | POA: Diagnosis present

## 2020-02-23 DIAGNOSIS — R410 Disorientation, unspecified: Secondary | ICD-10-CM | POA: Diagnosis not present

## 2020-02-23 DIAGNOSIS — F419 Anxiety disorder, unspecified: Secondary | ICD-10-CM | POA: Diagnosis not present

## 2020-02-23 DIAGNOSIS — R002 Palpitations: Secondary | ICD-10-CM | POA: Diagnosis not present

## 2020-02-23 LAB — CBC
HCT: 44.9 % (ref 36.0–46.0)
Hemoglobin: 14.3 g/dL (ref 12.0–15.0)
MCH: 31.9 pg (ref 26.0–34.0)
MCHC: 31.8 g/dL (ref 30.0–36.0)
MCV: 100.2 fL — ABNORMAL HIGH (ref 80.0–100.0)
Platelets: 388 10*3/uL (ref 150–400)
RBC: 4.48 MIL/uL (ref 3.87–5.11)
RDW: 12.6 % (ref 11.5–15.5)
WBC: 13 10*3/uL — ABNORMAL HIGH (ref 4.0–10.5)
nRBC: 0 % (ref 0.0–0.2)

## 2020-02-23 LAB — URINALYSIS, ROUTINE W REFLEX MICROSCOPIC
Bilirubin Urine: NEGATIVE
Glucose, UA: NEGATIVE mg/dL
Hgb urine dipstick: NEGATIVE
Ketones, ur: NEGATIVE mg/dL
Nitrite: NEGATIVE
Protein, ur: NEGATIVE mg/dL
Specific Gravity, Urine: 1.023 (ref 1.005–1.030)
pH: 5 (ref 5.0–8.0)

## 2020-02-23 LAB — BASIC METABOLIC PANEL
Anion gap: 12 (ref 5–15)
BUN: 25 mg/dL — ABNORMAL HIGH (ref 6–20)
CO2: 25 mmol/L (ref 22–32)
Calcium: 9.2 mg/dL (ref 8.9–10.3)
Chloride: 101 mmol/L (ref 98–111)
Creatinine, Ser: 1.3 mg/dL — ABNORMAL HIGH (ref 0.44–1.00)
GFR calc Af Amer: 54 mL/min — ABNORMAL LOW (ref >60)
GFR calc non Af Amer: 47 mL/min — ABNORMAL LOW (ref >60)
Glucose, Bld: 92 mg/dL (ref 70–99)
Potassium: 4.1 mmol/L (ref 3.5–5.1)
Sodium: 138 mmol/L (ref 135–145)

## 2020-02-23 LAB — CBG MONITORING, ED: Glucose-Capillary: 122 mg/dL — ABNORMAL HIGH (ref 70–99)

## 2020-02-23 LAB — POC URINE PREG, ED: Preg Test, Ur: NEGATIVE

## 2020-02-23 MED ORDER — SODIUM CHLORIDE 0.9 % IV BOLUS: 1000.0000 mL | Freq: Once | INTRAVENOUS | Status: DC

## 2020-02-23 NOTE — Discharge Instructions (Signed)
Follow-up with your primary doctor if symptoms or not improving in the next few days.  Continue medications as previously prescribed.

## 2020-02-23 NOTE — ED Notes (Addendum)
Pt given water to drink per EDP request. Pt aware of need for urine specimen.

## 2020-02-23 NOTE — ED Provider Notes (Signed)
[facility] EMERGENCY DEPARTMENT Provider Note   CSN: [id number] Arrival date & time: 02/23/20  1616     History Chief Complaint  Patient presents with  . Headache    Darlene Schwartz is a 54 y.o. female.  Patient is a 54 year old female with history of irritable bowel, chronic renal insufficiency, arthritis, anxiety, ADHD, prior PE.  She presents today for evaluation of multiple complaints.  She states that she has felt "out of it" for the past 10 days.  She has been driving and become disoriented and her children feel as though she has been confused.  She is now reporting intermittent palpitations for the past several days.  She denies to me she is having any chest pain or difficulty breathing.  She denies any fevers or chills.  The history is provided by the patient.       Past Medical History:  Diagnosis Date  . ADD (attention deficit disorder)   . Anxiety   . Anxiety   . Arthritis   . Chronic kidney disease    right nephrectomy  . Chronic sinus infection   . Fibromyalgia   . GERD (gastroesophageal reflux disease)   . History of blood clots   . Hypertension   . IBS (irritable bowel syndrome)   . Panic attacks   . Pulmonary embolism (HCC)    occured after nephrectomy; was on blood thinners for about 6 months post nephrectomy.  . Rheumatoid arthritis (HCC)   . Ulcerative colitis (HCC)   . Vertigo     Patient Active Problem List   Diagnosis Date Noted  . Seasonal and perennial allergic rhinitis 08/10/2019  . Recurrent infections 08/10/2019  . Food intolerance 08/10/2019  . Rheumatoid arthritis (HCC) 05/16/2017  . Ulcerative colitis (HCC) 10/07/2014    Past Surgical History:  Procedure Laterality Date  . ABDOMINAL HYSTERECTOMY    . ADENOIDECTOMY    . BIOPSY  11/23/2014   Procedure: BIOPSY;  Surgeon: West Bali, MD;  Location: [facility];  Service: Endoscopy;;  Right Colon, Left Colon, Rectal  . COLONOSCOPY WITH PROPOFOL N/A 11/23/2014   SLF: 1. Normal Ileum 2.  Left colon is redundant 3. Moderate proctitis.   Marland Kitchen DIAGNOSTIC LAPAROSCOPY    . KNEE ARTHROSCOPY Bilateral   . NEPHRECTOMY  2008   right side  . TONSILLECTOMY       OB History   No obstetric history on file.     Family History  Problem Relation Age of Onset  . Colon cancer Paternal Grandfather 42  . Ulcerative colitis Cousin   . Rheum arthritis Mother   . Allergic rhinitis Mother   . Rheum arthritis Paternal Grandmother   . Diverticulitis Father   . Prostate cancer Father   . Colon polyps Neg Hx   . Angioedema Neg Hx   . Asthma Neg Hx   . Atopy Neg Hx   . Eczema Neg Hx   . Immunodeficiency Neg Hx   . Urticaria Neg Hx     Social History   Tobacco Use  . Smoking status: Never Smoker  . Smokeless tobacco: Never Used  . Tobacco comment: Never smoked  Vaping Use  . Vaping Use: Never used  Substance Use Topics  . Alcohol use: No    Alcohol/week: 0.0 standard drinks  . Drug use: No    Home Medications Prior to Admission medications   Medication Sig Start Date End Date Taking? Authorizing Provider  acetaminophen (TYLENOL) 650 MG CR tablet Take 1,300 mg  by mouth daily as needed for pain.     [provider]  amitriptyline (ELAVIL) 75 MG tablet Take 75 mg by mouth at bedtime. 11/10/19   [provider]  atenolol (TENORMIN) 25 MG tablet Take 50 mg by mouth 2 (two) times daily. Pt has been out of this medication for 2 weeks    [provider]  azelastine (ASTELIN) 0.1 % nasal spray Place 2 sprays into both nostrils 2 (two) times daily. Use in each nostril as directed 11/18/19 12/18/19  Alfonse Spruce, MD  buPROPion (WELLBUTRIN XL) 150 MG 24 hr tablet  10/13/18   [provider]  citalopram (CELEXA) 40 MG tablet Take 20 mg by mouth at bedtime.     [provider]  dicyclomine (BENTYL) 10 MG capsule TAKE 1 CAPSULE (10 MG TOTAL) BY MOUTH 4 (FOUR) TIMES DAILY AS NEEDED. 05/12/18   Gelene Mink, NP  fexofenadine (ALLEGRA) 180 MG tablet  Take 180 mg by mouth daily.    [provider]  fluticasone (FLONASE) 50 MCG/ACT nasal spray Place 2 sprays into the nose daily.    [provider]  HYDROcodone-acetaminophen (NORCO) 10-325 MG tablet Take 1 tablet by mouth every 6 (six) hours as needed. 2-3 times daily    [provider]  loratadine (CLARITIN REDITABS) 10 MG dissolvable tablet Take 10 mg by mouth every morning.    [provider]  LORazepam (ATIVAN) 0.5 MG tablet Take 0.5 mg by mouth 3 (three) times daily as needed for anxiety.     [provider]  mesalamine (CANASA) 1000 MG suppository mesalamine 1,000 mg rectal suppository  1 SUPPOSITORY TWICE A DAY RECTAL 30 DAYS    [provider]  methylphenidate (RITALIN LA) 20 MG 24 hr capsule Take 20 mg by mouth at bedtime. 09/22/19   [provider]  NON FORMULARY Cortizone injections tri monthly   In hip joints    [provider]  Olopatadine HCl 0.6 % SOLN Two sprays per nostril up to twice daily. 12/09/19   Alfonse Spruce, MD  predniSONE (DELTASONE) 10 MG tablet Take 10 mg by mouth daily with breakfast.    [provider]  tiZANidine (ZANAFLEX) 4 MG tablet Take 4 mg by mouth at bedtime.    [provider]  traMADol (ULTRAM) 50 MG tablet Take 50 mg by mouth 3 (three) times daily as needed. 11/11/19   [provider]  Vitamin D, Ergocalciferol, (DRISDOL) 1.25 MG (50000 UNIT) CAPS capsule Take 50,000 Units by mouth every 7 (seven) days.    [provider]    Allergies    Other, Penicillins, Sulfa antibiotics, and Adhesive [tape]  Review of Systems   Review of Systems  All other systems reviewed and are negative.   Physical Exam Updated Vital Signs BP 100/86   Pulse 86   Temp 99.7 F (37.6 C) (Oral)   Resp 18   Ht 5\' 6"  (1.676 m)   Wt 106.6 kg   SpO2 98%   BMI 37.93 kg/m   Physical Exam Vitals and nursing note reviewed.  Constitutional:      General: She is  not in acute distress.    Appearance: She is well-developed. She is not diaphoretic.  HENT:     Head: Normocephalic and atraumatic.  Eyes:     General: No visual field deficit. Cardiovascular:     Rate and Rhythm: Normal rate and regular rhythm.     Heart sounds: No murmur heard.  No friction rub. No gallop.   Pulmonary:     Effort: Pulmonary effort is normal. No respiratory distress.     Breath sounds: Normal breath sounds. No wheezing.  Abdominal:     General: Bowel sounds are normal. There is no distension.     Palpations: Abdomen is soft.     Tenderness: There is no abdominal tenderness.  Musculoskeletal:        General: Normal range of motion.     Cervical back: Normal range of motion and neck supple.  Skin:    General: Skin is warm and dry.  Neurological:     Mental Status: She is alert and oriented to person, place, and time.     Cranial Nerves: No cranial nerve deficit.     Motor: No weakness.     Coordination: Coordination normal.     ED Results / Procedures / Treatments   Labs (all labs ordered are listed, but only abnormal results are displayed) Labs Reviewed  BASIC METABOLIC PANEL - Abnormal; Notable for the following components:      Result Value   BUN 25 (*)    Creatinine, Ser 1.30 (*)    GFR calc non Af Amer 47 (*)    GFR calc Af Amer 54 (*)    All other components within normal limits  CBC - Abnormal; Notable for the following components:   WBC 13.0 (*)    MCV 100.2 (*)    All other components within normal limits  CBG MONITORING, ED - Abnormal; Notable for the following components:   Glucose-Capillary 122 (*)    All other components within normal limits  URINALYSIS, ROUTINE W REFLEX MICROSCOPIC  POC URINE PREG, ED    EKG EKG Interpretation  Date/Time:  Tuesday February 23 2020 17:05:29 EDT Ventricular Rate:  100 PR Interval:  146 QRS Duration: 74 QT Interval:  352 QTC Calculation: 454 R Axis:   -7 Text Interpretation: Normal sinus rhythm Low  voltage QRS Cannot rule out Anterior infarct , age undetermined Abnormal ECG No significant change since 11/19/2014 Confirmed by Geoffery Lyons ([id number]) on 02/23/2020 5:28:09 PM   Radiology No results found.  Procedures Procedures (including critical care time)  Medications Ordered in ED Medications - No data to display  ED Course  I have reviewed the triage vital signs and the nursing notes.  Pertinent labs & imaging results that were available during my care of the patient were reviewed by me and considered in my medical decision making (see chart for details).    MDM Rules/Calculators/A&P  Patient presenting here with multiple complaints as outlined in the HPI.  Patient's vitals are stable and her work-up including laboratory studies and EKG is unremarkable.  Physical examination shows no focal neurologic deficit and head CT is negative.  At this point, I feel as though discharge with outpatient follow-up is appropriate.  Final Clinical Impression(s) / ED Diagnoses Final diagnoses:  None    Rx / DC Orders ED Discharge Orders    None       Geoffery Lyons, MD 02/23/20 2126

## 2020-02-23 NOTE — ED Triage Notes (Signed)
Pt reports headache and being "disoriented" x 10 days. Pt states the disorientation has been getting worse over the last day. Pt also reports sore throat and "trouble getting words out."

## 2020-02-24 ENCOUNTER — Ambulatory Visit: Payer: 59 | Admitting: Allergy & Immunology

## 2020-03-18 ENCOUNTER — Other Ambulatory Visit: Payer: Self-pay | Admitting: Allergy & Immunology

## 2020-08-11 ENCOUNTER — Encounter (HOSPITAL_COMMUNITY): Payer: Self-pay | Admitting: Emergency Medicine

## 2020-08-11 ENCOUNTER — Emergency Department (HOSPITAL_COMMUNITY)
Admission: EM | Admit: 2020-08-11 | Discharge: 2020-08-12 | Disposition: A | Discharge status: Home / Self Care | Payer: BLUE CROSS/BLUE SHIELD | Attending: Emergency Medicine | Admitting: Emergency Medicine

## 2020-08-11 ENCOUNTER — Other Ambulatory Visit: Payer: Self-pay

## 2020-08-11 DIAGNOSIS — Z79899 Other long term (current) drug therapy: Secondary | ICD-10-CM | POA: Diagnosis not present

## 2020-08-11 DIAGNOSIS — R112 Nausea with vomiting, unspecified: Secondary | ICD-10-CM | POA: Insufficient documentation

## 2020-08-11 DIAGNOSIS — I129 Hypertensive chronic kidney disease with stage 1 through stage 4 chronic kidney disease, or unspecified chronic kidney disease: Secondary | ICD-10-CM | POA: Insufficient documentation

## 2020-08-11 DIAGNOSIS — Z20822 Contact with and (suspected) exposure to covid-19: Secondary | ICD-10-CM | POA: Insufficient documentation

## 2020-08-11 DIAGNOSIS — R197 Diarrhea, unspecified: Secondary | ICD-10-CM | POA: Diagnosis not present

## 2020-08-11 DIAGNOSIS — E860 Dehydration: Secondary | ICD-10-CM | POA: Insufficient documentation

## 2020-08-11 DIAGNOSIS — N189 Chronic kidney disease, unspecified: Secondary | ICD-10-CM | POA: Diagnosis not present

## 2020-08-11 NOTE — ED Triage Notes (Signed)
Pt c/o N/V and diarrhea for several days. Pt states her husband had the same last week.

## 2020-08-12 ENCOUNTER — Emergency Department (HOSPITAL_COMMUNITY): Payer: BLUE CROSS/BLUE SHIELD

## 2020-08-12 LAB — C DIFFICILE QUICK SCREEN W PCR REFLEX
C Diff antigen: NEGATIVE
C Diff interpretation: NOT DETECTED
C Diff toxin: NEGATIVE

## 2020-08-12 LAB — CBC WITH DIFFERENTIAL/PLATELET
Abs Immature Granulocytes: 0.09 10*3/uL — ABNORMAL HIGH (ref 0.00–0.07)
Basophils Absolute: 0 10*3/uL (ref 0.0–0.1)
Basophils Relative: 0 %
Eosinophils Absolute: 0.1 10*3/uL (ref 0.0–0.5)
Eosinophils Relative: 1 %
HCT: 49.6 % — ABNORMAL HIGH (ref 36.0–46.0)
Hemoglobin: 15.5 g/dL — ABNORMAL HIGH (ref 12.0–15.0)
Immature Granulocytes: 1 %
Lymphocytes Relative: 4 %
Lymphs Abs: 0.6 10*3/uL — ABNORMAL LOW (ref 0.7–4.0)
MCH: 31.4 pg (ref 26.0–34.0)
MCHC: 31.3 g/dL (ref 30.0–36.0)
MCV: 100.4 fL — ABNORMAL HIGH (ref 80.0–100.0)
Monocytes Absolute: 0.5 10*3/uL (ref 0.1–1.0)
Monocytes Relative: 4 %
Neutro Abs: 12.9 10*3/uL — ABNORMAL HIGH (ref 1.7–7.7)
Neutrophils Relative %: 90 %
Platelets: 283 10*3/uL (ref 150–400)
RBC: 4.94 MIL/uL (ref 3.87–5.11)
RDW: 12.4 % (ref 11.5–15.5)
WBC: 14.2 10*3/uL — ABNORMAL HIGH (ref 4.0–10.5)
nRBC: 0 % (ref 0.0–0.2)

## 2020-08-12 LAB — COMPREHENSIVE METABOLIC PANEL
ALT: 23 U/L (ref 0–44)
AST: 24 U/L (ref 15–41)
Albumin: 3.8 g/dL (ref 3.5–5.0)
Alkaline Phosphatase: 84 U/L (ref 38–126)
Anion gap: 11 (ref 5–15)
BUN: 23 mg/dL — ABNORMAL HIGH (ref 6–20)
CO2: 21 mmol/L — ABNORMAL LOW (ref 22–32)
Calcium: 8.6 mg/dL — ABNORMAL LOW (ref 8.9–10.3)
Chloride: 105 mmol/L (ref 98–111)
Creatinine, Ser: 1.23 mg/dL — ABNORMAL HIGH (ref 0.44–1.00)
GFR, Estimated: 52 mL/min — ABNORMAL LOW (ref >60)
Glucose, Bld: 111 mg/dL — ABNORMAL HIGH (ref 70–99)
Potassium: 4.1 mmol/L (ref 3.5–5.1)
Sodium: 137 mmol/L (ref 135–145)
Total Bilirubin: 0.9 mg/dL (ref 0.3–1.2)
Total Protein: 7.1 g/dL (ref 6.5–8.1)

## 2020-08-12 LAB — RESP PANEL BY RT-PCR (FLU A&B, COVID) ARPGX2
Influenza A by PCR: NEGATIVE
Influenza B by PCR: NEGATIVE
SARS Coronavirus 2 by RT PCR: NEGATIVE

## 2020-08-12 LAB — LACTIC ACID, PLASMA
Lactic Acid, Venous: 4.1 mmol/L (ref 0.5–1.9)
Lactic Acid, Venous: 2.5 mmol/L (ref 0.5–1.9)

## 2020-08-12 LAB — PROTIME-INR
INR: 1 (ref 0.8–1.2)
Prothrombin Time: 13 seconds (ref 11.4–15.2)

## 2020-08-12 LAB — APTT: aPTT: 21 seconds — ABNORMAL LOW (ref 24–36)

## 2020-08-12 MED ORDER — SODIUM CHLORIDE 0.9 % IV BOLUS (SEPSIS)
1000.0000 mL | Freq: Once | INTRAVENOUS | Status: AC
  Administered 2020-08-12: 1000 mL via INTRAVENOUS

## 2020-08-12 MED ORDER — ONDANSETRON 8 MG PO TBDP: 8.0000 mg | ORAL_TABLET | Freq: Three times a day (TID) | ORAL | 0 refills | Status: DC | PRN

## 2020-08-12 MED ORDER — PREDNISONE 10 MG PO TABS
5.0000 mg | ORAL_TABLET | Freq: Once | ORAL | Status: AC
  Administered 2020-08-12: 5 mg via ORAL
  Filled 2020-08-12: qty 1

## 2020-08-12 MED ORDER — ACETAMINOPHEN 325 MG PO TABS
650.0000 mg | ORAL_TABLET | Freq: Once | ORAL | Status: AC
  Administered 2020-08-12: 650 mg via ORAL
  Filled 2020-08-12: qty 2

## 2020-08-12 MED ORDER — LACTATED RINGERS IV BOLUS (SEPSIS)
2000.0000 mL | Freq: Once | INTRAVENOUS | Status: AC
  Administered 2020-08-12: 2000 mL via INTRAVENOUS

## 2020-08-12 MED ORDER — LORAZEPAM 2 MG/ML IJ SOLN
1.0000 mg | Freq: Once | INTRAMUSCULAR | Status: AC
  Administered 2020-08-12: 1 mg via INTRAVENOUS
  Filled 2020-08-12: qty 1

## 2020-08-12 MED ORDER — ONDANSETRON HCL 4 MG/2ML IJ SOLN
4.0000 mg | Freq: Once | INTRAMUSCULAR | Status: AC
  Administered 2020-08-12: 4 mg via INTRAVENOUS
  Filled 2020-08-12: qty 2

## 2020-08-12 NOTE — ED Notes (Signed)
Pt given sprite for PO challenge

## 2020-08-12 NOTE — ED Notes (Signed)
Pt ambulatory to bathroom and back to room- steady gait

## 2020-08-12 NOTE — ED Provider Notes (Signed)
[facility] EMERGENCY DEPARTMENT Provider Note   CSN: [id number] Arrival date & time: 08/11/20  2336     History Chief Complaint  Patient presents with  . Diarrhea    Darlene Schwartz is a 54 y.o. female.  The history is provided by the patient.  Diarrhea Severity:  Severe Number of episodes:  11 Duration:  12 hours Timing:  Intermittent Progression:  Worsening Relieved by:  Nothing Worsened by:  Nothing Associated symptoms: chills and vomiting   Associated symptoms: no abdominal pain and no fever   Risk factors: sick contacts    Patient with extensive history including rheumatoid arthritis, previous PE, fibromyalgia presents with vomiting and diarrhea.  She reports abrupt onset over 12 hours ago with multiple episodes of both vomiting and diarrhea.  No blood in the vomit or stool.  No significant abdominal pain.  She reports chills.  She reports anxiety.  She reports she is unable to take her anxiety medicine and unable to take her daily steroids. She reports her husband had similar illness    Past Medical History:  Diagnosis Date  . ADD (attention deficit disorder)   . Anxiety   . Anxiety   . Arthritis   . Chronic kidney disease    right nephrectomy  . Chronic sinus infection   . Fibromyalgia   . GERD (gastroesophageal reflux disease)   . History of blood clots   . Hypertension   . IBS (irritable bowel syndrome)   . Panic attacks   . Pulmonary embolism (HCC)    occured after nephrectomy; was on blood thinners for about 6 months post nephrectomy.  . Rheumatoid arthritis (HCC)   . Ulcerative colitis (HCC)   . Vertigo     Patient Active Problem List   Diagnosis Date Noted  . Seasonal and perennial allergic rhinitis 08/10/2019  . Recurrent infections 08/10/2019  . Food intolerance 08/10/2019  . Rheumatoid arthritis (HCC) 05/16/2017  . Ulcerative colitis (HCC) 10/07/2014    Past Surgical History:  Procedure Laterality Date  . ABDOMINAL HYSTERECTOMY    .  ADENOIDECTOMY    . BIOPSY  11/23/2014   Procedure: BIOPSY;  Surgeon: West Bali, MD;  Location: [facility];  Service: Endoscopy;;  Right Colon, Left Colon, Rectal  . COLONOSCOPY WITH PROPOFOL N/A 11/23/2014   SLF: 1. Normal Ileum 2. Left colon is redundant 3. Moderate proctitis.   [facility] Kitchen DIAGNOSTIC LAPAROSCOPY    . KNEE ARTHROSCOPY Bilateral   . NEPHRECTOMY  2008   right side  . TONSILLECTOMY       OB History   No obstetric history on file.     Family History  Problem Relation Age of Onset  . Colon cancer Paternal Grandfather 71  . Ulcerative colitis Cousin   . Rheum arthritis Mother   . Allergic rhinitis Mother   . Rheum arthritis Paternal Grandmother   . Diverticulitis Father   . Prostate cancer Father   . Colon polyps Neg Hx   . Angioedema Neg Hx   . Asthma Neg Hx   . Atopy Neg Hx   . Eczema Neg Hx   . Immunodeficiency Neg Hx   . Urticaria Neg Hx     Social History   Tobacco Use  . Smoking status: Never Smoker  . Smokeless tobacco: Never Used  . Tobacco comment: Never smoked  Vaping Use  . Vaping Use: Never used  Substance Use Topics  . Alcohol use: No    Alcohol/week: 0.0 standard drinks  .  Drug use: No    Home Medications Prior to Admission medications   Medication Sig Start Date End Date Taking? Authorizing Provider  acetaminophen (TYLENOL) 650 MG CR tablet Take 1,300 mg by mouth daily as needed for pain.     [provider]  amitriptyline (ELAVIL) 75 MG tablet Take 75 mg by mouth at bedtime. 11/10/19   [provider]  atenolol (TENORMIN) 25 MG tablet Take 50 mg by mouth 2 (two) times daily. Pt has been out of this medication for 2 weeks    [provider]  azelastine (ASTELIN) 0.1 % nasal spray PLACE TWO (2) SPRAYS INTO BOTH NOSTRILS TWO (2) (TWO) TIMES DAILY. USE IN EACH NOSTRIL AS DIRECTED 03/18/20   Alfonse Spruce, MD  buPROPion (WELLBUTRIN XL) 150 MG 24 hr tablet  10/13/18   [provider]  citalopram (CELEXA) 40 MG  tablet Take 20 mg by mouth at bedtime.     [provider]  dicyclomine (BENTYL) 10 MG capsule TAKE 1 CAPSULE (10 MG TOTAL) BY MOUTH 4 (FOUR) TIMES DAILY AS NEEDED. 05/12/18   Gelene Mink, NP  fexofenadine (ALLEGRA) 180 MG tablet Take 180 mg by mouth daily.    [provider]  fluticasone (FLONASE) 50 MCG/ACT nasal spray Place 2 sprays into the nose daily.    [provider]  HYDROcodone-acetaminophen (NORCO) 10-325 MG tablet Take 1 tablet by mouth every 6 (six) hours as needed. 2-3 times daily    [provider]  loratadine (CLARITIN REDITABS) 10 MG dissolvable tablet Take 10 mg by mouth every morning.    [provider]  LORazepam (ATIVAN) 0.5 MG tablet Take 0.5 mg by mouth 3 (three) times daily as needed for anxiety.     [provider]  mesalamine (CANASA) 1000 MG suppository mesalamine 1,000 mg rectal suppository  1 SUPPOSITORY TWICE A DAY RECTAL 30 DAYS    [provider]  methylphenidate (RITALIN LA) 20 MG 24 hr capsule Take 20 mg by mouth at bedtime. 09/22/19   [provider]  NON FORMULARY Cortizone injections tri monthly   In hip joints    [provider]  Olopatadine HCl 0.6 % SOLN Two sprays per nostril up to twice daily. 12/09/19   Alfonse Spruce, MD  predniSONE (DELTASONE) 10 MG tablet Take 10 mg by mouth daily with breakfast.    [provider]  tiZANidine (ZANAFLEX) 4 MG tablet Take 4 mg by mouth at bedtime.    [provider]  traMADol (ULTRAM) 50 MG tablet Take 50 mg by mouth 3 (three) times daily as needed. 11/11/19   [provider]  Vitamin D, Ergocalciferol, (DRISDOL) 1.25 MG (50000 UNIT) CAPS capsule Take 50,000 Units by mouth every 7 (seven) days.    [provider]    Allergies    Other, Penicillins, Sulfa antibiotics, and Adhesive [tape]  Review of Systems   Review of Systems  Constitutional: Positive for chills. Negative for fever.   Gastrointestinal: Positive for diarrhea and vomiting. Negative for abdominal pain.  All other systems reviewed and are negative.   Physical Exam Updated Vital Signs BP 114/65   Pulse (!) 122   Temp 98.6 F (37 C) (Oral)   Resp (!) 21   Ht 1.676 m (5\' 6" )   Wt 106.6 kg   SpO2 96%   BMI 37.93 kg/m   Physical Exam CONSTITUTIONAL: Ill-appearing HEAD: Normocephalic/atraumatic EYES: EOMI/PERRL, no icterus ENMT: Mucous membranes dry NECK: supple no meningeal signs SPINE/BACK:entire  spine nontender CV: S1/S2 noted, tachycardic LUNGS: Lungs are clear to auscultation bilaterally, no apparent distress ABDOMEN: soft, nontender, no rebound or guarding, bowel sounds noted throughout abdomen GU:no cva tenderness NEURO: Pt is awake/alert/appropriate, moves all extremitiesx4.  No facial droop.   EXTREMITIES: pulses normal/equal, full ROM SKIN: warm, color normal PSYCH: Anxious  ED Results / Procedures / Treatments   Labs (all labs ordered are listed, but only abnormal results are displayed) Labs Reviewed  LACTIC ACID, PLASMA - Abnormal; Notable for the following components:      Result Value   Lactic Acid, Venous 4.1 (*)    All other components within normal limits  LACTIC ACID, PLASMA - Abnormal; Notable for the following components:   Lactic Acid, Venous 2.5 (*)    All other components within normal limits  COMPREHENSIVE METABOLIC PANEL - Abnormal; Notable for the following components:   CO2 21 (*)    Glucose, Bld 111 (*)    BUN 23 (*)    Creatinine, Ser 1.23 (*)    Calcium 8.6 (*)    GFR, Estimated 52 (*)    All other components within normal limits  CBC WITH DIFFERENTIAL/PLATELET - Abnormal; Notable for the following components:   WBC 14.2 (*)    Hemoglobin 15.5 (*)    HCT 49.6 (*)    MCV 100.4 (*)    Neutro Abs 12.9 (*)    Lymphs Abs 0.6 (*)    Abs Immature Granulocytes 0.09 (*)    All other components within normal limits  APTT - Abnormal; Notable for the following  components:   aPTT 21 (*)    All other components within normal limits  RESP PANEL BY RT-PCR (FLU A&B, COVID) ARPGX2  C DIFFICILE QUICK SCREEN W PCR REFLEX  GASTROINTESTINAL PANEL BY PCR, STOOL (REPLACES STOOL CULTURE)  PROTIME-INR    EKG EKG Interpretation  Date/Time:  Friday August 12 2020 00:49:30 EST Ventricular Rate:  127 PR Interval:    QRS Duration: 82 QT Interval:  335 QTC Calculation: 487 R Axis:   52 Text Interpretation: Sinus tachycardia Borderline low voltage, extremity leads Borderline prolonged QT interval Abnormal ekg Confirmed by Zadie Rhine ([id number]) on 08/12/2020 12:59:53 AM   Radiology DG Chest Port 1 View  Result Date: 08/12/2020 CLINICAL DATA:  Cough EXAM: PORTABLE CHEST 1 VIEW COMPARISON:  None. FINDINGS: The heart size and mediastinal contours are within normal limits. Both lungs are clear. The visualized skeletal structures are unremarkable. IMPRESSION: No active disease. Electronically Signed   By: Jonna Clark M.D.   On: 08/12/2020 02:50    Procedures .Critical Care Performed by: Zadie Rhine, MD Authorized by: Zadie Rhine, MD   Critical care provider statement:    Critical care time (minutes):  60   Critical care start time:  08/12/2020 1:10 AM   Critical care end time:  08/12/2020 2:10 AM   Critical care time was exclusive of:  Separately billable procedures and treating other patients   Critical care was necessary to treat or prevent imminent or life-threatening deterioration of the following conditions:  Dehydration and metabolic crisis   Critical care was time spent personally by me on the following activities:  Examination of patient, evaluation of patient's response to treatment, obtaining history from patient or surrogate, re-evaluation of patient's condition, pulse oximetry, ordering and review of laboratory studies, ordering and performing treatments and interventions, review of old charts, ordering and review of radiographic  studies and development of treatment plan with patient or surrogate  I assumed direction of critical care for this patient from another provider in my specialty: no       Medications Ordered in ED Medications  ondansetron (ZOFRAN) injection 4 mg (4 mg Intravenous Given 08/12/20 0103)  lactated ringers bolus 2,000 mL (0 mLs Intravenous Stopped 08/12/20 0257)  predniSONE (DELTASONE) tablet 5 mg (5 mg Oral Given 08/12/20 0103)  LORazepam (ATIVAN) injection 1 mg (1 mg Intravenous Given 08/12/20 0103)  sodium chloride 0.9 % bolus 1,000 mL (0 mLs Intravenous Stopped 08/12/20 0316)  sodium chloride 0.9 % bolus 1,000 mL (0 mLs Intravenous Stopped 08/12/20 0641)    ED Course  I have reviewed the triage vital signs and the nursing notes.  Pertinent labs & imaging results that were available during my care of the patient were reviewed by me and considered in my medical decision making (see chart for details).    MDM Rules/Calculators/A&P                          1:11 AM Patient presents with abrupt onset of vomiting/diarrhea.  Patient appears dehydrated and she is tachycardic.  Patient is chronically taking prednisone, will give her a dose of this year to avoid any adrenal issues.  We will also give her a dose of Ativan for her anxiety. IV fluids ordered 2:44 AM Lactate is elevated, though suspect this may be due to dehydration.  Patient has no focal abdominal tenderness.  She does report mild cough, but otherwise was well until the vomiting and diarrhea began. Will defer antibiotics for now.  Will check lactate after 3 L of fluids 5:27 AM Overall patient is improved.  Patient reports she is feeling improved.  However she is tachycardic and still has not had urine output She denies any abdominal pain.  Lactate is improving. She is taking p.o. fluids.  Plan to give another liter of fluid and reassess. 7:09 AM Patient continues to improve.  Heart rate was around 109 while resting.  Her blood  pressure is stable in the low 100s.  With movement her heart rate does increase but she also appears anxious. I suspect she has underlying viral gastroenteritis that triggered this causing her dehydration. She denies any pain complaints.  She is taking p.o. fluids. Advised that she needs rest and increase oral fluids at home.  Short course of Zofran has been ordered for patient  No signs of sepsis at this time. Final Clinical Impression(s) / ED Diagnoses Final diagnoses:  Nausea vomiting and diarrhea  Dehydration    Rx / DC Orders ED Discharge Orders         Ordered    ondansetron (ZOFRAN ODT) 8 MG disintegrating tablet  Every 8 hours PRN        08/12/20 0708           Zadie Rhine, MD 08/12/20 0710

## 2020-08-12 NOTE — ED Notes (Signed)
Date and time results received: 08/12/20 1:53 AM (use smartphrase ".now" to insert current time)  Test: lactic acid Critical Value: 4.1  Name of Provider Notified: Dr Bebe Shaggy  Orders Received? Or Actions Taken?: see chart

## 2020-08-12 NOTE — ED Notes (Signed)
Pt given gingerale for PO challenge 

## 2020-08-13 LAB — GASTROINTESTINAL PANEL BY PCR, STOOL (REPLACES STOOL CULTURE)

## 2020-08-13 NOTE — ED Notes (Addendum)
Date and time results received: 08/13/20 1411   Test: GI panel Critical Value: E. Coli and Norovirus  Name of Provider Notified: Dr. Estell Harpin  Orders Received? Or Actions Taken?: To notify pt of virus and to follow up with pcp.

## 2020-08-13 NOTE — ED Notes (Signed)
Contacted pt regarding positive norovirus result. Pt did not answer. Voicemail left with callback number stating that pt needs to continue to hydrate and follow-up with pcp.

## 2023-01-09 ENCOUNTER — Emergency Department (HOSPITAL_COMMUNITY)
Admission: EM | Admit: 2023-01-09 | Discharge: 2023-01-10 | Disposition: A | Discharge status: Home / Self Care | Payer: BC Managed Care – PPO | Attending: Emergency Medicine | Admitting: Emergency Medicine

## 2023-01-09 ENCOUNTER — Other Ambulatory Visit: Payer: Self-pay

## 2023-01-09 ENCOUNTER — Encounter (HOSPITAL_COMMUNITY): Payer: Self-pay | Admitting: Emergency Medicine

## 2023-01-09 DIAGNOSIS — R531 Weakness: Secondary | ICD-10-CM | POA: Diagnosis present

## 2023-01-09 DIAGNOSIS — R0781 Pleurodynia: Secondary | ICD-10-CM | POA: Diagnosis not present

## 2023-01-09 DIAGNOSIS — E860 Dehydration: Secondary | ICD-10-CM | POA: Insufficient documentation

## 2023-01-09 DIAGNOSIS — R112 Nausea with vomiting, unspecified: Secondary | ICD-10-CM | POA: Insufficient documentation

## 2023-01-09 DIAGNOSIS — R509 Fever, unspecified: Secondary | ICD-10-CM | POA: Insufficient documentation

## 2023-01-09 LAB — COMPREHENSIVE METABOLIC PANEL
ALT: 47 U/L — ABNORMAL HIGH (ref 0–44)
AST: 38 U/L (ref 15–41)
Albumin: 4 g/dL (ref 3.5–5.0)
Alkaline Phosphatase: 68 U/L (ref 38–126)
Anion gap: 11 (ref 5–15)
BUN: 24 mg/dL — ABNORMAL HIGH (ref 6–20)
CO2: 26 mmol/L (ref 22–32)
Calcium: 8.9 mg/dL (ref 8.9–10.3)
Chloride: 99 mmol/L (ref 98–111)
Creatinine, Ser: 1.42 mg/dL — ABNORMAL HIGH (ref 0.44–1.00)
GFR, Estimated: 43 mL/min — ABNORMAL LOW (ref >60)
Glucose, Bld: 109 mg/dL — ABNORMAL HIGH (ref 70–99)
Potassium: 3.7 mmol/L (ref 3.5–5.1)
Sodium: 136 mmol/L (ref 135–145)
Total Bilirubin: 0.8 mg/dL (ref 0.3–1.2)
Total Protein: 7.3 g/dL (ref 6.5–8.1)

## 2023-01-09 LAB — CBC
HCT: 46.5 % — ABNORMAL HIGH (ref 36.0–46.0)
Hemoglobin: 15.3 g/dL — ABNORMAL HIGH (ref 12.0–15.0)
MCH: 31.9 pg (ref 26.0–34.0)
MCHC: 32.9 g/dL (ref 30.0–36.0)
MCV: 97.1 fL (ref 80.0–100.0)
Platelets: 364 10*3/uL (ref 150–400)
RBC: 4.79 MIL/uL (ref 3.87–5.11)
RDW: 12.7 % (ref 11.5–15.5)
WBC: 12.3 10*3/uL — ABNORMAL HIGH (ref 4.0–10.5)
nRBC: 0 % (ref 0.0–0.2)

## 2023-01-09 LAB — LIPASE, BLOOD: Lipase: 44 U/L (ref 11–51)

## 2023-01-09 MED ORDER — ONDANSETRON HCL 4 MG/2ML IJ SOLN
4.0000 mg | Freq: Once | INTRAMUSCULAR | Status: DC | PRN
Start: 2023-01-09 — End: 2023-01-10

## 2023-01-09 NOTE — ED Triage Notes (Signed)
Pt states she became very nauseated and vomited a dozen times on Friday night. Temp 102.4 at home with coughing and severe pain to rib cage afterwards from vomiting. Pt still feels nauseated and says she feels sweaty and palpitations when she stands up. She has been too weak to walk or tolerate activity. Poor appetite.

## 2023-01-10 ENCOUNTER — Emergency Department (HOSPITAL_COMMUNITY): Payer: BC Managed Care – PPO

## 2023-01-10 LAB — URINALYSIS, ROUTINE W REFLEX MICROSCOPIC
Bilirubin Urine: NEGATIVE
Glucose, UA: NEGATIVE mg/dL
Hgb urine dipstick: NEGATIVE
Ketones, ur: NEGATIVE mg/dL
Leukocytes,Ua: NEGATIVE
Nitrite: NEGATIVE
Protein, ur: NEGATIVE mg/dL
Specific Gravity, Urine: 1.008 (ref 1.005–1.030)
pH: 5 (ref 5.0–8.0)

## 2023-01-10 LAB — TROPONIN I (HIGH SENSITIVITY)
Troponin I (High Sensitivity): 3 ng/L (ref <18)
Troponin I (High Sensitivity): 3 ng/L (ref <18)

## 2023-01-10 MED ORDER — SODIUM CHLORIDE 0.9 % IV BOLUS
1000.0000 mL | Freq: Once | INTRAVENOUS | Status: AC
  Administered 2023-01-10: 1000 mL via INTRAVENOUS

## 2023-01-10 MED ORDER — IOHEXOL 300 MG/ML  SOLN
75.0000 mL | Freq: Once | INTRAMUSCULAR | Status: AC | PRN
  Administered 2023-01-10: 75 mL via INTRAVENOUS

## 2023-01-10 NOTE — ED Notes (Signed)
Xr at bedside

## 2023-01-10 NOTE — ED Provider Notes (Signed)
[place] EMERGENCY DEPARTMENT AT [facility] Provider Note   CSN: [id number] Arrival date & time: 01/09/23  1947     History  Chief Complaint  Patient presents with   Weakness   Emesis   Dehydration    Darlene Schwartz is a 57 y.o. female.  Patient reports that she has not been feeling well for a week.  After taking a friend to another hospital for a test, she developed acute onset of nausea and vomiting with fever.  She reports that she was in bed for several days.  She had multiple episodes of voluminous, sudden vomiting over that period of time.  Since then she has developed some pain and pressure in the right lower rib/lung area.  She reports that she feels very weak, cannot walk around because she could feel her heart beating fast, she gets sweaty and short of breath.       Home Medications Prior to Admission medications   Medication Sig Start Date End Date Taking? Authorizing Provider  acetaminophen (TYLENOL) 650 MG CR tablet Take 1,300 mg by mouth daily as needed for pain.     [provider]  amitriptyline (ELAVIL) 75 MG tablet Take 75 mg by mouth at bedtime. 11/10/19   [provider]  atenolol (TENORMIN) 25 MG tablet Take 50 mg by mouth 2 (two) times daily. Pt has been out of this medication for 2 weeks    [provider]  azelastine (ASTELIN) 0.1 % nasal spray PLACE TWO (2) SPRAYS INTO BOTH NOSTRILS TWO (2) (TWO) TIMES DAILY. USE IN EACH NOSTRIL AS DIRECTED 03/18/20   Alfonse Spruce, MD  buPROPion (WELLBUTRIN XL) 150 MG 24 hr tablet  10/13/18   [provider]  citalopram (CELEXA) 40 MG tablet Take 20 mg by mouth at bedtime.     [provider]  dicyclomine (BENTYL) 10 MG capsule TAKE 1 CAPSULE (10 MG TOTAL) BY MOUTH 4 (FOUR) TIMES DAILY AS NEEDED. 05/12/18   Gelene Mink, NP  fexofenadine (ALLEGRA) 180 MG tablet Take 180 mg by mouth daily.    [provider]  fluticasone (FLONASE) 50 MCG/ACT nasal spray  Place 2 sprays into the nose daily.    [provider]  HYDROcodone-acetaminophen (NORCO) 10-325 MG tablet Take 1 tablet by mouth every 6 (six) hours as needed. 2-3 times daily    [provider]  loratadine (CLARITIN REDITABS) 10 MG dissolvable tablet Take 10 mg by mouth every morning.    [provider]  LORazepam (ATIVAN) 0.5 MG tablet Take 0.5 mg by mouth 3 (three) times daily as needed for anxiety.     [provider]  mesalamine (CANASA) 1000 MG suppository mesalamine 1,000 mg rectal suppository  1 SUPPOSITORY TWICE A DAY RECTAL 30 DAYS    [provider]  methylphenidate (RITALIN LA) 20 MG 24 hr capsule Take 20 mg by mouth at bedtime. 09/22/19   [provider]  NON FORMULARY Cortizone injections tri monthly   In hip joints    [provider]  Olopatadine HCl 0.6 % SOLN Two sprays per nostril up to twice daily. 12/09/19   Alfonse Spruce, MD  ondansetron (ZOFRAN ODT) 8 MG disintegrating tablet Take 1 tablet (8 mg total) by mouth every 8 (eight) hours as needed. 08/12/20   Zadie Rhine, MD  predniSONE (DELTASONE) 10 MG tablet Take 10 mg by mouth daily with breakfast.    [provider]  tiZANidine (ZANAFLEX) 4 MG tablet Take  4 mg by mouth at bedtime.    [provider]  traMADol (ULTRAM) 50 MG tablet Take 50 mg by mouth 3 (three) times daily as needed. 11/11/19   [provider]  Vitamin D, Ergocalciferol, (DRISDOL) 1.25 MG (50000 UNIT) CAPS capsule Take 50,000 Units by mouth every 7 (seven) days.    [provider]      Allergies    Other, Penicillins, Sulfa antibiotics, and Adhesive [tape]    Review of Systems   Review of Systems  Physical Exam Updated Vital Signs BP 113/71   Pulse 88   Temp 98.5 F (36.9 C) (Oral)   Resp 19   Ht 5\' 6"  (1.676 m)   Wt 117 kg   SpO2 98%   BMI 41.64 kg/m  Physical Exam Vitals and nursing note reviewed.  Constitutional:      General: She  is not in acute distress.    Appearance: She is well-developed.  HENT:     Head: Normocephalic and atraumatic.     Mouth/Throat:     Mouth: Mucous membranes are moist.  Eyes:     General: Vision grossly intact. Gaze aligned appropriately.     Extraocular Movements: Extraocular movements intact.     Conjunctiva/sclera: Conjunctivae normal.  Cardiovascular:     Rate and Rhythm: Normal rate and regular rhythm.     Pulses: Normal pulses.     Heart sounds: Normal heart sounds, S1 normal and S2 normal. No murmur heard.    No friction rub. No gallop.  Pulmonary:     Effort: Pulmonary effort is normal. No respiratory distress.     Breath sounds: Normal breath sounds.  Abdominal:     General: Bowel sounds are normal.     Palpations: Abdomen is soft.     Tenderness: There is no abdominal tenderness. There is no guarding or rebound.     Hernia: No hernia is present.  Musculoskeletal:        General: No swelling.     Cervical back: Full passive range of motion without pain, normal range of motion and neck supple. No spinous process tenderness or muscular tenderness. Normal range of motion.     Right lower leg: No edema.     Left lower leg: No edema.  Skin:    General: Skin is warm and dry.     Capillary Refill: Capillary refill takes less than 2 seconds.     Findings: No ecchymosis, erythema, rash or wound.  Neurological:     General: No focal deficit present.     Mental Status: She is alert and oriented to person, place, and time.     GCS: GCS eye subscore is 4. GCS verbal subscore is 5. GCS motor subscore is 6.     Cranial Nerves: Cranial nerves 2-12 are intact.     Sensory: Sensation is intact.     Motor: Motor function is intact.     Coordination: Coordination is intact.  Psychiatric:        Attention and Perception: Attention normal.        Mood and Affect: Mood normal.        Speech: Speech normal.        Behavior: Behavior normal.     ED Results / Procedures / Treatments    Labs (all labs ordered are listed, but only abnormal results are displayed) Labs Reviewed  COMPREHENSIVE METABOLIC PANEL - Abnormal; Notable for the following components:      Result Value  Glucose, Bld 109 (*)    BUN 24 (*)    Creatinine, Ser 1.42 (*)    ALT 47 (*)    GFR, Estimated 43 (*)    All other components within normal limits  CBC - Abnormal; Notable for the following components:   WBC 12.3 (*)    Hemoglobin 15.3 (*)    HCT 46.5 (*)    All other components within normal limits  LIPASE, BLOOD  URINALYSIS, ROUTINE W REFLEX MICROSCOPIC  TROPONIN I (HIGH SENSITIVITY)  TROPONIN I (HIGH SENSITIVITY)    EKG None  Radiology CT ABDOMEN PELVIS W CONTRAST  Result Date: 01/10/2023 CLINICAL DATA:  Abdominal pain, acute, nonlocalized, nausea, vomiting, fever EXAM: CT ABDOMEN AND PELVIS WITH CONTRAST TECHNIQUE: Multidetector CT imaging of the abdomen and pelvis was performed using the standard protocol following bolus administration of intravenous contrast. RADIATION DOSE REDUCTION: This exam was performed according to the departmental dose-optimization program which includes automated exposure control, adjustment of the mA and/or kV according to patient size and/or use of iterative reconstruction technique. CONTRAST:  75mL OMNIPAQUE IOHEXOL 300 MG/ML  SOLN COMPARISON:  None Available. FINDINGS: Lower chest: No acute abnormality Hepatobiliary: No focal hepatic abnormality. Gallbladder unremarkable. Pancreas: No focal abnormality or ductal dilatation. Spleen: No focal abnormality.  Normal size. Adrenals/Urinary Tract: Prior right nephrectomy. Adrenal glands and left kidney unremarkable. No hydronephrosis. Urinary bladder decompressed, grossly unremarkable. Stomach/Bowel: Normal appendix. Stomach, large and small bowel grossly unremarkable. Vascular/Lymphatic: Aortic atherosclerosis. No evidence of aneurysm or adenopathy. Reproductive: Prior hysterectomy.  No adnexal masses. Other: No free  fluid or free air. Musculoskeletal: No acute bony abnormality. IMPRESSION: No acute findings in the abdomen or pelvis. Electronically Signed   By: Charlett Nose M.D.   On: 01/10/2023 03:41   DG Chest Port 1 View  Result Date: 01/10/2023 CLINICAL DATA:  Right chest pain EXAM: PORTABLE CHEST 1 VIEW COMPARISON:  08/12/2020 FINDINGS: The heart size and mediastinal contours are within normal limits. Both lungs are clear. The visualized skeletal structures are unremarkable. IMPRESSION: Normal study. Electronically Signed   By: Charlett Nose M.D.   On: 01/10/2023 02:33    Procedures Procedures    Medications Ordered in ED Medications  ondansetron (ZOFRAN) injection 4 mg (has no administration in time range)  sodium chloride 0.9 % bolus 1,000 mL (1,000 mLs Intravenous New Bag/Given 01/10/23 0306)  iohexol (OMNIPAQUE) 300 MG/ML solution 75 mL (75 mLs Intravenous Contrast Given 01/10/23 1610)    ED Course/ Medical Decision Making/ A&P                             Medical Decision Making Amount and/or Complexity of Data Reviewed Labs: ordered. Radiology: ordered.  Risk Prescription drug management.   Differential Diagnosis considered includes, but not limited to: Cholelithiasis; cholecystitis; cholangitis; bowel obstruction; esophagitis; gastritis; peptic ulcer disease; pancreatitis; cardiac.  Presents to the emergency department with multiple complaints.  Patient reports she developed a febrile illness nearly a week ago with nausea and vomiting.  Fever has resolved and she is no longer vomiting but she is feeling very weak.  Patient feels dehydrated.  She was tachycardic at arrival.  No fever associated, no concern for sepsis.  Patient given IV fluids and analgesia.  She is experiencing some pain on the right side of the chest.  This is likely secondary to her vomiting, chest wall in nature.  Atypical for cardiac, troponins negative x 2.  Chest x-ray without evidence  of pneumonia.  CT abdomen and  pelvis performed.  No acute findings noted.        Final Clinical Impression(s) / ED Diagnoses Final diagnoses:  Dehydration  Weakness    Rx / DC Orders ED Discharge Orders     None         Tarquin Welcher, Canary Brim, MD 01/10/23 0430

## 2023-02-05 ENCOUNTER — Ambulatory Visit (INDEPENDENT_AMBULATORY_CARE_PROVIDER_SITE_OTHER): Payer: BC Managed Care – PPO | Admitting: Gastroenterology

## 2023-02-05 ENCOUNTER — Encounter: Payer: Self-pay | Admitting: Gastroenterology

## 2023-02-05 VITALS — BP 138/84 | HR 130 | Temp 99.1°F | Ht 66.0 in | Wt 255.0 lb

## 2023-02-05 DIAGNOSIS — R5382 Chronic fatigue, unspecified: Secondary | ICD-10-CM

## 2023-02-05 DIAGNOSIS — K5120 Ulcerative (chronic) proctitis without complications: Secondary | ICD-10-CM | POA: Diagnosis not present

## 2023-02-05 DIAGNOSIS — K649 Unspecified hemorrhoids: Secondary | ICD-10-CM | POA: Diagnosis not present

## 2023-02-05 MED ORDER — PEG 3350-KCL-NA BICARB-NACL 420 G PO SOLR: 4000.0000 mL | Freq: Once | ORAL | 0 refills | Status: AC

## 2023-02-05 NOTE — H&P (View-Only) (Signed)
 GI Office Note    Referring Provider: Achilles Dunk, DO Primary Care Physician:  Achilles Dunk, DO  Primary Gastroenterologist: Hennie Duos. Marletta Lor, DO (previously Dr. Darrick Penna)  Chief Complaint   Chief Complaint  Patient presents with   New Patient (Initial Visit)    Patient says she was a patient here previously and had to get re referred to follow up on UC and IBS. She does not take any medications for her UC. She does take Bentyl tid prn for her ibs. She also takes imodium and pepto prn.   History of Present Illness   Darlene Schwartz is a 57 y.o. female presenting today at the request of Welton Flakes, Salma Z, DO for ulcerative colitis and hemorrhoids.  Per review of chart it appears patient was diagnosed with ulcerative proctitis as a teenager.  Colonoscopy April 2016: -Normal ileum -Redundant left colon -Moderate proctitis -Moderate to large external hemorrhoids. -Biopsy revealed quiescent chronic colitis of the rectum. -Advised to follow high-fiber diet and start Canasa suppositories twice daily, take dicyclomine before breakfast and lunch 3 days a week.  Avoid NSAIDs. -Follow-up with Dr. Lovell Sheehan for consideration of hemorrhoidectomy  Last seen in November 2016.  Patient reported that discontinuing Asacol did not make much of a difference initially but she has been having more flares recently.  She stated on previous colonoscopies throughout the years that she has had colitis and other locations of her colon besides her rectum and was curious if suppositories were providing adequate coverage for her.  She was having varying abdominal pain and a pulling sensation in the right lower quadrant.  Known to have extensive endometriosis requiring complete hysterectomy in her 74s and this pain seems to be worse when she has a flare.  Had also been having increased urinary frequency with flares but denied any dysuria or nocturia.  She had a recent UA that was negative per the patient.  States Bentyl  seems to be less effective than previously and also complaining of significant fatigue.  Also at times having epigastric and LUQ pain and unsure if it relates to meals.  She had quit Nexium on her own secondary to kidney disease as she only has 1 kidney.  Denies any heartburn.  Tries to avoid Humira as she is worried about immune suppression and recurrent sickness.  She had recently been put back on low-dose prednisone 2.5 mg daily for her RA.  She was started on Lialda 4 tablets daily for her colitis flare and to reduce to 2 tablets daily after 4 weeks.  Advised to check kidney function 2 weeks after starting medication and advised that she could stop Canasa suppositories after being on Lialda for a month.  Per her last follow-up in November 2016 Dr. Darrick Penna I recommended CT abdomen pelvis to look for active disease proximal to the rectum which was identified on her most recent colonoscopy and felt as though given she only has 1 kidney that oral mesalamine should not be used unless absolutely necessary and if there was no evidence of active disease on CT scan then they could concentrate on IBS-D therapy and consider Viberzi for her abdominal pain and diarrhea.  She was advised to complete clindamycin and call with within 2 weeks.  She was to let Verlon Au know if she wanted to pursue CT scan.  Today:  Will be having labs done with rheumatology within the next week (CBC, CMP, ANA, rheumatoid factor, CRP, ESR, vitamin D)  ED visit 5/29  for dehydration from vomiting a few weeks. Drinks propel in water frequently to work on staying hydrated.  She states that the random 24 hours of vomiting caused her dehydration.  Takes 1/2 phentermine as needed for help with ADHD and chronic fatigue. Not having great quality of life right now given her pain and fatigue. 3 weeks prior oto schools getting our she had a lot of trauma and a little more diarrhea. Has lost 15lbs in the last 3 weeks due to decreased appetite. Has not  wanted to eat.  If she eats she has about 3 stools daily. Stools seem to be more yellow but denies any bleeding. Does feel like stools have a stronger odor. Not watery but is loose. Uses dicyclomine as needed. Beef is worse on her stomach. She also takes it for work.  She states she received a letter from our office within the last couple of years to repeat colonoscopy but had put off some of her health issues and currently is ready to schedule.  Has severe joint pain and fibromyalgia as well. Has been on a prednisone taper recently and was given an injection for pain. Has been under a lot of emotional flares and then she has flares after that.   Mother recently diagnosed with myasthenia gravis.   History of PE in 2008/2009.   Having a little more gassiness as well as hiccups. Has not had any acid reflux recently.   Is taking vitamin D and B12 as well.   Has had a few falls in the last 2 years and having some balance issues.   Current Outpatient Medications  Medication Sig Dispense Refill   acetaminophen (TYLENOL) 650 MG CR tablet Take 1,300 mg by mouth daily as needed for pain.      amitriptyline (ELAVIL) 75 MG tablet Take 150 mg by mouth at bedtime.     atenolol (TENORMIN) 25 MG tablet Take 12.5 mg by mouth 2 (two) times daily. Pt has been out of this medication for 2 weeks     citalopram (CELEXA) 40 MG tablet Take 20 mg by mouth at bedtime.      dicyclomine (BENTYL) 10 MG capsule TAKE 1 CAPSULE (10 MG TOTAL) BY MOUTH 4 (FOUR) TIMES DAILY AS NEEDED. 120 capsule 4   HYDROcodone-acetaminophen (NORCO) 10-325 MG tablet Take 1 tablet by mouth every 6 (six) hours as needed. 2-3 times daily     LORazepam (ATIVAN) 0.5 MG tablet Take 0.5 mg by mouth 3 (three) times daily as needed for anxiety.      methylphenidate (RITALIN LA) 20 MG 24 hr capsule Take 20 mg by mouth at bedtime.     NON FORMULARY Cortizone injections tri monthly   In hip joints     predniSONE (DELTASONE) 10 MG tablet Take 10 mg by  mouth daily with breakfast.     mesalamine (CANASA) 1000 MG suppository mesalamine 1,000 mg rectal suppository  1 SUPPOSITORY TWICE A DAY RECTAL 30 DAYS (Patient not taking: Reported on 02/05/2023)     tiZANidine (ZANAFLEX) 4 MG tablet Take 4 mg by mouth at bedtime.     traMADol (ULTRAM) 50 MG tablet Take 50 mg by mouth 3 (three) times daily as needed.     Vitamin D, Ergocalciferol, (DRISDOL) 1.25 MG (50000 UNIT) CAPS capsule Take 50,000 Units by mouth every 7 (seven) days.     No current facility-administered medications for this visit.    Past Medical History:  Diagnosis Date   ADD (attention deficit disorder)  Anxiety    Anxiety    Arthritis    Chronic kidney disease    right nephrectomy   Chronic sinus infection    Fibromyalgia    GERD (gastroesophageal reflux disease)    History of blood clots    Hypertension    IBS (irritable bowel syndrome)    Panic attacks    Pulmonary embolism (HCC)    occured after nephrectomy; was on blood thinners for about 6 months post nephrectomy.   Rheumatoid arthritis (HCC)    Ulcerative colitis (HCC)    Vertigo     Past Surgical History:  Procedure Laterality Date   ABDOMINAL HYSTERECTOMY     ADENOIDECTOMY     BIOPSY  11/23/2014   Procedure: BIOPSY;  Surgeon: West Bali, MD;  Location: [facility];  Service: Endoscopy;;  Right Colon, Left Colon, Rectal   COLONOSCOPY WITH PROPOFOL N/A 11/23/2014   SLF: 1. Normal Ileum 2. Left colon is redundant 3. Moderate proctitis.    DIAGNOSTIC LAPAROSCOPY     KNEE ARTHROSCOPY Bilateral    NEPHRECTOMY  2008   right side   TONSILLECTOMY      Family History  Problem Relation Age of Onset   Colon cancer Paternal Grandfather 52   Ulcerative colitis Cousin    Rheum arthritis Mother    Allergic rhinitis Mother    Rheum arthritis Paternal Grandmother    Diverticulitis Father    Prostate cancer Father    Colon polyps Neg Hx    Angioedema Neg Hx    Asthma Neg Hx    Atopy Neg Hx    Eczema Neg Hx     Immunodeficiency Neg Hx    Urticaria Neg Hx     Allergies as of 02/05/2023 - Review Complete 02/05/2023  Allergen Reaction Noted   Other  05/16/2017   Penicillins Itching 01/23/2012   Sulfa antibiotics  08/05/2019   Adhesive [tape] Rash 01/23/2012    Social History   Socioeconomic History   Marital status: Married    Spouse name: Willaim   Number of children: 5   Years of education: Not on file   Highest education level: Master's degree (e.g., MA, MS, MEng, MEd, MSW, MBA)  Occupational History   Occupation: N/A  Tobacco Use   Smoking status: Never   Smokeless tobacco: Never   Tobacco comments:    Never smoked  Vaping Use   Vaping Use: Never used  Substance and Sexual Activity   Alcohol use: No    Alcohol/week: 0.0 standard drinks of alcohol   Drug use: No   Sexual activity: Yes    Birth control/protection: Surgical  Other Topics Concern   Not on file  Social History Narrative   ADOPTED TWIN BOYS: AGE 34. MARRIED-4 YRS(2ND MARRIAGE). 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   Left handed    Caffeine 1-2 cups daily    Lives at home with spouse and kids    Social Determinants of Health   Financial Resource Strain: Not on file  Food Insecurity: Not on file  Transportation Needs: Not on file  Physical Activity: Not on file  Stress: Not on file  Social Connections: Not on file  Intimate Partner Violence: Not on file     Review of Systems   Gen: Denies any fever, chills, fatigue, weight loss, lack of appetite.  CV: Denies chest pain, heart palpitations, peripheral edema, syncope.  Resp: Denies shortness of breath at rest or with exertion. Denies  wheezing or cough.  GI: see HPI GU : Denies urinary burning, urinary frequency, urinary hesitancy MS: Denies joint pain, muscle weakness, cramps, or limitation of movement.  Derm: Denies rash, itching, dry skin Psych: Denies depression, anxiety, memory loss, and  confusion Heme: Denies bruising, bleeding, and enlarged lymph nodes.   Physical Exam   BP 138/84 (BP Location: Right Arm, Patient Position: Sitting, Cuff Size: Normal)   Pulse (!) 130   Temp 99.1 F (37.3 C) (Oral)   Ht 5\' 6"  (1.676 m)   Wt 255 lb (115.7 kg)   BMI 41.16 kg/m   General:   Alert and oriented. Pleasant and cooperative. Well-nourished and well-developed.  Head:  Normocephalic and atraumatic. Eyes:  Without icterus, sclera clear and conjunctiva pink.  Ears:  Normal auditory acuity. Mouth:  No deformity or lesions, oral mucosa pink.  Lungs:  Clear to auscultation bilaterally. No wheezes, rales, or rhonchi. No distress.  Heart:  S1, S2 present without murmurs appreciated.  Abdomen:  +BS, soft, non-tender and non-distended. No HSM noted. No guarding or rebound. No masses appreciated. Rectal:  Deferred  Msk:  Symmetrical without gross deformities. Normal posture. Extremities:  Without edema. Neurologic:  Alert and  oriented x4;  grossly normal neurologically. Skin:  Intact without significant lesions or rashes. Psych:  Alert and cooperative. Normal mood and affect.   Assessment   Darlene Schwartz is a 57 y.o. female with a history of rheumatoid arthritis, ulcerative colitis (proctitis?),  Fibromyalgia, anxiety, ADD, CKD, HTN, IBS, and PE post nephrectomy who presents today to reestablish care regarding her ulcerative colitis and diarrhea.  Ulcerative colitis: Patient provide diagnosis as a teenager.  Remote history of use of mesalamine in the past.  Last colonoscopy in April 2016 with only water proctitis with biopsies revealing quiescent chronic colitis.  Given her localized disease she was recommended to start Canasa suppositories and then later in 2016 she began-symptoms and was recommended to start Lialda initially however then advised to stop this given concern for renal function that she only has 1 kidney.  She does continue will notice prednisone given coexistent  rheumatoid arthritis.  She continues to have chronic fatigue and her stool frequency varies on what she eats, recently has been eating a lot less.  Only using dicyclomine as needed stools have been loose but not watery.  She denies any melena or BRBPR.  She has reported 15 pound weight loss over the last 3 weeks due to decreased appetite which is likely secondary to her IBD as well as emotional stress.  Will follow-up labs that are being performed this week for rheumatology.  Will also perform colonoscopy for surveillance of disease activity as well as screening for colon cancer.  Given her diarrhea and intermittent abdominal pain I have asked her that she can continue Bentyl as needed and for her gassiness/preps that she can and famotidine nightly.  We did discuss the potential to begin biologic for treatment of UC and RA and she has reported hesitancy in the past however is more open to the idea now given the extent of her pain and her mild fecal urgency.  Hemorrhoids: Noted to have moderately to large external hemorrhoids in the past  PLAN   Follow up upcoming labs with rheumatology Proceed with colonoscopy with propofol by Dr. Marletta Lor in near future: the risks, benefits, and alternatives have been discussed with the patient in detail. The patient states understanding and desires to proceed. ASA 3 Continue bentyl as needed. Pepcid as  needed nightly. Follow up in 3 months   Brooke Bonito, MSN, FNP-BC, AGACNP-BC [facility]

## 2023-02-05 NOTE — Progress Notes (Signed)
GI Office Note    Referring Provider: Achilles Dunk, DO Primary Care Physician:  Achilles Dunk, DO  Primary Gastroenterologist: Hennie Duos. Marletta Lor, DO (previously Dr. Darrick Penna)  Chief Complaint   Chief Complaint  Patient presents with   New Patient (Initial Visit)    Patient says she was a patient here previously and had to get re referred to follow up on UC and IBS. She does not take any medications for her UC. She does take Bentyl tid prn for her ibs. She also takes imodium and pepto prn.   History of Present Illness   Darlene Schwartz is a 57 y.o. female presenting today at the request of Welton Flakes, Salma Z, DO for ulcerative colitis and hemorrhoids.  Per review of chart it appears patient was diagnosed with ulcerative proctitis as a teenager.  Colonoscopy April 2016: -Normal ileum -Redundant left colon -Moderate proctitis -Moderate to large external hemorrhoids. -Biopsy revealed quiescent chronic colitis of the rectum. -Advised to follow high-fiber diet and start Canasa suppositories twice daily, take dicyclomine before breakfast and lunch 3 days a week.  Avoid NSAIDs. -Follow-up with Dr. Lovell Sheehan for consideration of hemorrhoidectomy  Last seen in November 2016.  Patient reported that discontinuing Asacol did not make much of a difference initially but she has been having more flares recently.  She stated on previous colonoscopies throughout the years that she has had colitis and other locations of her colon besides her rectum and was curious if suppositories were providing adequate coverage for her.  She was having varying abdominal pain and a pulling sensation in the right lower quadrant.  Known to have extensive endometriosis requiring complete hysterectomy in her 74s and this pain seems to be worse when she has a flare.  Had also been having increased urinary frequency with flares but denied any dysuria or nocturia.  She had a recent UA that was negative per the patient.  States Bentyl  seems to be less effective than previously and also complaining of significant fatigue.  Also at times having epigastric and LUQ pain and unsure if it relates to meals.  She had quit Nexium on her own secondary to kidney disease as she only has 1 kidney.  Denies any heartburn.  Tries to avoid Humira as she is worried about immune suppression and recurrent sickness.  She had recently been put back on low-dose prednisone 2.5 mg daily for her RA.  She was started on Lialda 4 tablets daily for her colitis flare and to reduce to 2 tablets daily after 4 weeks.  Advised to check kidney function 2 weeks after starting medication and advised that she could stop Canasa suppositories after being on Lialda for a month.  Per her last follow-up in November 2016 Dr. Darrick Penna I recommended CT abdomen pelvis to look for active disease proximal to the rectum which was identified on her most recent colonoscopy and felt as though given she only has 1 kidney that oral mesalamine should not be used unless absolutely necessary and if there was no evidence of active disease on CT scan then they could concentrate on IBS-D therapy and consider Viberzi for her abdominal pain and diarrhea.  She was advised to complete clindamycin and call with within 2 weeks.  She was to let Verlon Au know if she wanted to pursue CT scan.  Today:  Will be having labs done with rheumatology within the next week (CBC, CMP, ANA, rheumatoid factor, CRP, ESR, vitamin D)  ED visit 5/29  for dehydration from vomiting a few weeks. Drinks propel in water frequently to work on staying hydrated.  She states that the random 24 hours of vomiting caused her dehydration.  Takes 1/2 phentermine as needed for help with ADHD and chronic fatigue. Not having great quality of life right now given her pain and fatigue. 3 weeks prior oto schools getting our she had a lot of trauma and a little more diarrhea. Has lost 15lbs in the last 3 weeks due to decreased appetite. Has not  wanted to eat.  If she eats she has about 3 stools daily. Stools seem to be more yellow but denies any bleeding. Does feel like stools have a stronger odor. Not watery but is loose. Uses dicyclomine as needed. Beef is worse on her stomach. She also takes it for work.  She states she received a letter from our office within the last couple of years to repeat colonoscopy but had put off some of her health issues and currently is ready to schedule.  Has severe joint pain and fibromyalgia as well. Has been on a prednisone taper recently and was given an injection for pain. Has been under a lot of emotional flares and then she has flares after that.   Mother recently diagnosed with myasthenia gravis.   History of PE in 2008/2009.   Having a little more gassiness as well as hiccups. Has not had any acid reflux recently.   Is taking vitamin D and B12 as well.   Has had a few falls in the last 2 years and having some balance issues.   Current Outpatient Medications  Medication Sig Dispense Refill   acetaminophen (TYLENOL) 650 MG CR tablet Take 1,300 mg by mouth daily as needed for pain.      amitriptyline (ELAVIL) 75 MG tablet Take 150 mg by mouth at bedtime.     atenolol (TENORMIN) 25 MG tablet Take 12.5 mg by mouth 2 (two) times daily. Pt has been out of this medication for 2 weeks     citalopram (CELEXA) 40 MG tablet Take 20 mg by mouth at bedtime.      dicyclomine (BENTYL) 10 MG capsule TAKE 1 CAPSULE (10 MG TOTAL) BY MOUTH 4 (FOUR) TIMES DAILY AS NEEDED. 120 capsule 4   HYDROcodone-acetaminophen (NORCO) 10-325 MG tablet Take 1 tablet by mouth every 6 (six) hours as needed. 2-3 times daily     LORazepam (ATIVAN) 0.5 MG tablet Take 0.5 mg by mouth 3 (three) times daily as needed for anxiety.      methylphenidate (RITALIN LA) 20 MG 24 hr capsule Take 20 mg by mouth at bedtime.     NON FORMULARY Cortizone injections tri monthly   In hip joints     predniSONE (DELTASONE) 10 MG tablet Take 10 mg by  mouth daily with breakfast.     mesalamine (CANASA) 1000 MG suppository mesalamine 1,000 mg rectal suppository  1 SUPPOSITORY TWICE A DAY RECTAL 30 DAYS (Patient not taking: Reported on 02/05/2023)     tiZANidine (ZANAFLEX) 4 MG tablet Take 4 mg by mouth at bedtime.     traMADol (ULTRAM) 50 MG tablet Take 50 mg by mouth 3 (three) times daily as needed.     Vitamin D, Ergocalciferol, (DRISDOL) 1.25 MG (50000 UNIT) CAPS capsule Take 50,000 Units by mouth every 7 (seven) days.     No current facility-administered medications for this visit.    Past Medical History:  Diagnosis Date   ADD (attention deficit disorder)  Anxiety    Anxiety    Arthritis    Chronic kidney disease    right nephrectomy   Chronic sinus infection    Fibromyalgia    GERD (gastroesophageal reflux disease)    History of blood clots    Hypertension    IBS (irritable bowel syndrome)    Panic attacks    Pulmonary embolism (HCC)    occured after nephrectomy; was on blood thinners for about 6 months post nephrectomy.   Rheumatoid arthritis (HCC)    Ulcerative colitis (HCC)    Vertigo     Past Surgical History:  Procedure Laterality Date   ABDOMINAL HYSTERECTOMY     ADENOIDECTOMY     BIOPSY  11/23/2014   Procedure: BIOPSY;  Surgeon: West Bali, MD;  Location: [facility];  Service: Endoscopy;;  Right Colon, Left Colon, Rectal   COLONOSCOPY WITH PROPOFOL N/A 11/23/2014   SLF: 1. Normal Ileum 2. Left colon is redundant 3. Moderate proctitis.    DIAGNOSTIC LAPAROSCOPY     KNEE ARTHROSCOPY Bilateral    NEPHRECTOMY  2008   right side   TONSILLECTOMY      Family History  Problem Relation Age of Onset   Colon cancer Paternal Grandfather 52   Ulcerative colitis Cousin    Rheum arthritis Mother    Allergic rhinitis Mother    Rheum arthritis Paternal Grandmother    Diverticulitis Father    Prostate cancer Father    Colon polyps Neg Hx    Angioedema Neg Hx    Asthma Neg Hx    Atopy Neg Hx    Eczema Neg Hx     Immunodeficiency Neg Hx    Urticaria Neg Hx     Allergies as of 02/05/2023 - Review Complete 02/05/2023  Allergen Reaction Noted   Other  05/16/2017   Penicillins Itching 01/23/2012   Sulfa antibiotics  08/05/2019   Adhesive [tape] Rash 01/23/2012    Social History   Socioeconomic History   Marital status: Married    Spouse name: Willaim   Number of children: 5   Years of education: Not on file   Highest education level: Master's degree (e.g., MA, MS, MEng, MEd, MSW, MBA)  Occupational History   Occupation: N/A  Tobacco Use   Smoking status: Never   Smokeless tobacco: Never   Tobacco comments:    Never smoked  Vaping Use   Vaping Use: Never used  Substance and Sexual Activity   Alcohol use: No    Alcohol/week: 0.0 standard drinks of alcohol   Drug use: No   Sexual activity: Yes    Birth control/protection: Surgical  Other Topics Concern   Not on file  Social History Narrative   ADOPTED TWIN BOYS: AGE 34. MARRIED-4 YRS(2ND MARRIAGE). 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   Left handed    Caffeine 1-2 cups daily    Lives at home with spouse and kids    Social Determinants of Health   Financial Resource Strain: Not on file  Food Insecurity: Not on file  Transportation Needs: Not on file  Physical Activity: Not on file  Stress: Not on file  Social Connections: Not on file  Intimate Partner Violence: Not on file     Review of Systems   Gen: Denies any fever, chills, fatigue, weight loss, lack of appetite.  CV: Denies chest pain, heart palpitations, peripheral edema, syncope.  Resp: Denies shortness of breath at rest or with exertion. Denies  wheezing or cough.  GI: see HPI GU : Denies urinary burning, urinary frequency, urinary hesitancy MS: Denies joint pain, muscle weakness, cramps, or limitation of movement.  Derm: Denies rash, itching, dry skin Psych: Denies depression, anxiety, memory loss, and  confusion Heme: Denies bruising, bleeding, and enlarged lymph nodes.   Physical Exam   BP 138/84 (BP Location: Right Arm, Patient Position: Sitting, Cuff Size: Normal)   Pulse (!) 130   Temp 99.1 F (37.3 C) (Oral)   Ht 5\' 6"  (1.676 m)   Wt 255 lb (115.7 kg)   BMI 41.16 kg/m   General:   Alert and oriented. Pleasant and cooperative. Well-nourished and well-developed.  Head:  Normocephalic and atraumatic. Eyes:  Without icterus, sclera clear and conjunctiva pink.  Ears:  Normal auditory acuity. Mouth:  No deformity or lesions, oral mucosa pink.  Lungs:  Clear to auscultation bilaterally. No wheezes, rales, or rhonchi. No distress.  Heart:  S1, S2 present without murmurs appreciated.  Abdomen:  +BS, soft, non-tender and non-distended. No HSM noted. No guarding or rebound. No masses appreciated. Rectal:  Deferred  Msk:  Symmetrical without gross deformities. Normal posture. Extremities:  Without edema. Neurologic:  Alert and  oriented x4;  grossly normal neurologically. Skin:  Intact without significant lesions or rashes. Psych:  Alert and cooperative. Normal mood and affect.   Assessment   Darlene Schwartz is a 57 y.o. female with a history of rheumatoid arthritis, ulcerative colitis (proctitis?),  Fibromyalgia, anxiety, ADD, CKD, HTN, IBS, and PE post nephrectomy who presents today to reestablish care regarding her ulcerative colitis and diarrhea.  Ulcerative colitis: Patient provide diagnosis as a teenager.  Remote history of use of mesalamine in the past.  Last colonoscopy in April 2016 with only water proctitis with biopsies revealing quiescent chronic colitis.  Given her localized disease she was recommended to start Canasa suppositories and then later in 2016 she began-symptoms and was recommended to start Lialda initially however then advised to stop this given concern for renal function that she only has 1 kidney.  She does continue will notice prednisone given coexistent  rheumatoid arthritis.  She continues to have chronic fatigue and her stool frequency varies on what she eats, recently has been eating a lot less.  Only using dicyclomine as needed stools have been loose but not watery.  She denies any melena or BRBPR.  She has reported 15 pound weight loss over the last 3 weeks due to decreased appetite which is likely secondary to her IBD as well as emotional stress.  Will follow-up labs that are being performed this week for rheumatology.  Will also perform colonoscopy for surveillance of disease activity as well as screening for colon cancer.  Given her diarrhea and intermittent abdominal pain I have asked her that she can continue Bentyl as needed and for her gassiness/preps that she can and famotidine nightly.  We did discuss the potential to begin biologic for treatment of UC and RA and she has reported hesitancy in the past however is more open to the idea now given the extent of her pain and her mild fecal urgency.  Hemorrhoids: Noted to have moderately to large external hemorrhoids in the past  PLAN   Follow up upcoming labs with rheumatology Proceed with colonoscopy with propofol by Dr. Marletta Lor in near future: the risks, benefits, and alternatives have been discussed with the patient in detail. The patient states understanding and desires to proceed. ASA 3 Continue bentyl as needed. Pepcid as  needed nightly. Follow up in 3 months   Brooke Bonito, MSN, FNP-BC, AGACNP-BC [facility]

## 2023-02-05 NOTE — Patient Instructions (Addendum)
We will schedule you for colonoscopy in the near future with Dr. Marletta Lor.  After review of your most recent lab work I am concerned about your kidney function.  For now I feel like the safest prep for you would be the traditional gallon jug to make sure we do not have any kidney damage.  Depending on when you are scheduled for if you are labs that you are having done upcoming showing improvement of kidney function or within normal range I may consider a lower volume prep as we discussed but ultimately I want to be sure we are taking care of the single kidney that you have.  You can continue Bentyl as needed for the diarrhea and abdominal pain.  I would recommend that you use Pepcid nightly as needed to help with the hiccups and any stomach upset in place of Pepto-Bismol.  I will have you follow-up in the office in 3 months.  As we discussed please have your rheumatologist fax [place] your lab results.  It was a pleasure to see you today. I want to create trusting relationships with patients. If you receive a survey regarding your visit,  I greatly appreciate you taking time to fill this out on paper or through your MyChart. I value your feedback.  Brooke Bonito, MSN, FNP-BC, AGACNP-BC [facility]

## 2023-02-06 ENCOUNTER — Encounter (INDEPENDENT_AMBULATORY_CARE_PROVIDER_SITE_OTHER): Payer: Self-pay | Admitting: Family Medicine

## 2023-02-06 ENCOUNTER — Encounter: Payer: Self-pay | Admitting: Gastroenterology

## 2023-02-08 ENCOUNTER — Telehealth (INDEPENDENT_AMBULATORY_CARE_PROVIDER_SITE_OTHER): Payer: Self-pay | Admitting: Internal Medicine

## 2023-02-08 NOTE — Telephone Encounter (Signed)
Left detailed message informing patient of pre op appt Pre op scheduled for 02/22/23 @ 8:30am Darlene Schwartz

## 2023-02-11 ENCOUNTER — Telehealth: Payer: Self-pay

## 2023-02-11 NOTE — Telephone Encounter (Signed)
New lab results are available from outside provider under the [facility] tab.

## 2023-02-13 NOTE — Telephone Encounter (Signed)
Spoke to pt, informed her of recommendations. Pt voiced understanding.  

## 2023-02-20 NOTE — Patient Instructions (Signed)
Darlene Schwartz  02/20/2023     @PREFPERIOPPHARMACY @   Your procedure is scheduled on  02/25/2023.   [facility] to [facility] at  0945  A.M.   Call this number if you have problems the morning of surgery:  [phone]  If you experience any cold or flu symptoms such as cough, fever, chills, shortness of breath, etc. between now and your scheduled surgery, please notify us at the above number.   Remember:  Follow the diet and prep instructions given to you by the office.     Take these medicines the morning of surgery with A SIP OF WATER           atenolol, hydrocodone(if needed), lorazepam (if needed), prednisone.     Do not wear jewelry, make-up or nail polish, including gel polish,  artificial nails, or any other type of covering on natural nails (fingers and  toes).  Do not wear lotions, powders, or perfumes, or deodorant.  Do not shave 48 hours prior to surgery.  Men may shave face and neck.  Do not bring valuables to the hospital.  [facility] is not responsible for any belongings or valuables.  Contacts, dentures or bridgework may not be worn into surgery.  Leave your suitcase in the car.  After surgery it may be brought to your room.  For patients admitted to the hospital, discharge time will be determined by your treatment team.  Patients discharged the day of surgery will not be allowed to drive home and must have someone with them for 24 hours.    Special instructions:   DO NOT smoke tobacco or vape for 24 hours before your procedure.  Please read over the following fact sheets that you were given. Anesthesia Post-op Instructions and Care and Recovery After Surgery      Colonoscopy, Adult, Care After The following information offers guidance on how to care for yourself after your procedure. Your health care provider may also give you more specific instructions. If you have problems or questions, contact your health care provider. What can I expect after  the procedure? After the procedure, it is common to have: A small amount of blood in your stool for 24 hours after the procedure. Some gas. Mild cramping or bloating of your abdomen. Follow these instructions at home: Eating and drinking  Drink enough fluid to keep your urine pale yellow. Follow instructions from your health care provider about eating or drinking restrictions. Resume your normal diet as told by your health care provider. Avoid heavy or fried foods that are hard to digest. Activity Rest as told by your health care provider. Avoid sitting for a long time without moving. Get up to take short walks every 1-2 hours. This is important to improve blood flow and breathing. Ask for help if you feel weak or unsteady. Return to your normal activities as told by your health care provider. Ask your health care provider what activities are safe for you. Managing cramping and bloating  Try walking around when you have cramps or feel bloated. If directed, apply heat to your abdomen as told by your health care provider. Use the heat source that your health care provider recommends, such as a moist heat pack or a heating pad. Place a towel between your skin and the heat source. Leave the heat on for 20-30 minutes. Remove the heat if your skin turns bright red. This is especially important if you are unable to  feel pain, heat, or cold. You have a greater risk of getting burned. General instructions If you were given a sedative during the procedure, it can affect you for several hours. Do not drive or operate machinery until your health care provider says that it is safe. For the first 24 hours after the procedure: Do not sign important documents. Do not drink alcohol. Do your regular daily activities at a slower pace than normal. Eat soft foods that are easy to digest. Take over-the-counter and prescription medicines only as told by your health care provider. Keep all follow-up visits.  This is important. Contact a health care provider if: You have blood in your stool 2-3 days after the procedure. Get help right away if: You have more than a small spotting of blood in your stool. You have large blood clots in your stool. You have swelling of your abdomen. You have nausea or vomiting. You have a fever. You have increasing pain in your abdomen that is not relieved with medicine. These symptoms may be an emergency. Get help right away. Call 911. Do not wait to see if the symptoms will go away. Do not drive yourself to the hospital. Summary After the procedure, it is common to have a small amount of blood in your stool. You may also have mild cramping and bloating of your abdomen. If you were given a sedative during the procedure, it can affect you for several hours. Do not drive or operate machinery until your health care provider says that it is safe. Get help right away if you have a lot of blood in your stool, nausea or vomiting, a fever, or increased pain in your abdomen. This information is not intended to replace advice given to you by your health care provider. Make sure you discuss any questions you have with your health care provider. Document Revised: 09/11/2022 Document Reviewed: 03/22/2021 Elsevier Patient Education  2024 [employer] Monitored Anesthesia Care, Care After The following information offers guidance on how to care for yourself after your procedure. Your health care provider may also give you more specific instructions. If you have problems or questions, contact your health care provider. What can I expect after the procedure? After the procedure, it is common to have: Tiredness. Little or no memory about what happened during or after the procedure. Impaired judgment when it comes to making decisions. Nausea or vomiting. Some trouble with balance. Follow these instructions at home: For the time period you were told by your health care  provider:  Rest. Do not participate in activities where you could fall or become injured. Do not drive or use machinery. Do not drink alcohol. Do not take sleeping pills or medicines that cause drowsiness. Do not make important decisions or sign legal documents. Do not take care of children on your own. Medicines Take over-the-counter and prescription medicines only as told by your health care provider. If you were prescribed antibiotics, take them as told by your health care provider. Do not stop using the antibiotic even if you start to feel better. Eating and drinking Follow instructions from your health care provider about what you may eat and drink. Drink enough fluid to keep your urine pale yellow. If you vomit: Drink clear fluids slowly and in small amounts as you are able. Clear fluids include water, ice chips, low-calorie sports drinks, and fruit juice that has water added to it (diluted fruit juice). Eat light and bland foods in small amounts as you are able.  These foods include bananas, applesauce, rice, lean meats, toast, and crackers. General instructions  Have a responsible adult stay with you for the time you are told. It is important to have someone help care for you until you are awake and alert. If you have sleep apnea, surgery and some medicines can increase your risk for breathing problems. Follow instructions from your health care provider about wearing your sleep device: When you are sleeping. This includes during daytime naps. While taking prescription pain medicines, sleeping medicines, or medicines that make you drowsy. Do not use any products that contain nicotine or tobacco. These products include cigarettes, chewing tobacco, and vaping devices, such as e-cigarettes. If you need help quitting, ask your health care provider. Contact a health care provider if: You feel nauseous or vomit every time you eat or drink. You feel light-headed. You are still sleepy or  having trouble with balance after 24 hours. You get a rash. You have a fever. You have redness or swelling around the IV site. Get help right away if: You have trouble breathing. You have new confusion after you get home. These symptoms may be an emergency. Get help right away. Call 911. Do not wait to see if the symptoms will go away. Do not drive yourself to the hospital. This information is not intended to replace advice given to you by your health care provider. Make sure you discuss any questions you have with your health care provider. Document Revised: 12/25/2021 Document Reviewed: 12/25/2021 Elsevier Patient Education  2024 [employer].

## 2023-02-21 ENCOUNTER — Other Ambulatory Visit: Payer: Self-pay | Admitting: *Deleted

## 2023-02-21 MED ORDER — PEG 3350-KCL-NA BICARB-NACL 420 G PO SOLR: 4000.0000 mL | Freq: Once | ORAL | 0 refills | Status: AC

## 2023-02-22 ENCOUNTER — Encounter (HOSPITAL_COMMUNITY)
Admission: RE | Admit: 2023-02-22 | Discharge: 2023-02-22 | Disposition: A | Discharge status: Home / Self Care | Payer: BC Managed Care – PPO | Source: Ambulatory Visit | Attending: Internal Medicine | Admitting: Internal Medicine

## 2023-02-22 VITALS — BP 138/84 | HR 100 | Temp 98.4°F | Resp 18 | Ht 66.0 in | Wt 251.0 lb

## 2023-02-22 DIAGNOSIS — N189 Chronic kidney disease, unspecified: Secondary | ICD-10-CM | POA: Diagnosis not present

## 2023-02-22 DIAGNOSIS — I129 Hypertensive chronic kidney disease with stage 1 through stage 4 chronic kidney disease, or unspecified chronic kidney disease: Secondary | ICD-10-CM | POA: Diagnosis not present

## 2023-02-22 DIAGNOSIS — K5120 Ulcerative (chronic) proctitis without complications: Secondary | ICD-10-CM | POA: Diagnosis present

## 2023-02-22 DIAGNOSIS — F988 Other specified behavioral and emotional disorders with onset usually occurring in childhood and adolescence: Secondary | ICD-10-CM | POA: Diagnosis not present

## 2023-02-22 DIAGNOSIS — I10 Essential (primary) hypertension: Secondary | ICD-10-CM | POA: Insufficient documentation

## 2023-02-22 DIAGNOSIS — Z8711 Personal history of peptic ulcer disease: Secondary | ICD-10-CM | POA: Diagnosis not present

## 2023-02-22 DIAGNOSIS — Z6841 Body Mass Index (BMI) 40.0 and over, adult: Secondary | ICD-10-CM | POA: Diagnosis not present

## 2023-02-22 DIAGNOSIS — M797 Fibromyalgia: Secondary | ICD-10-CM | POA: Diagnosis not present

## 2023-02-22 DIAGNOSIS — Z905 Acquired absence of kidney: Secondary | ICD-10-CM | POA: Diagnosis not present

## 2023-02-22 DIAGNOSIS — F419 Anxiety disorder, unspecified: Secondary | ICD-10-CM | POA: Diagnosis not present

## 2023-02-22 DIAGNOSIS — D124 Benign neoplasm of descending colon: Secondary | ICD-10-CM | POA: Diagnosis not present

## 2023-02-22 DIAGNOSIS — Z79899 Other long term (current) drug therapy: Secondary | ICD-10-CM | POA: Diagnosis not present

## 2023-02-22 DIAGNOSIS — K648 Other hemorrhoids: Secondary | ICD-10-CM | POA: Diagnosis not present

## 2023-02-25 ENCOUNTER — Ambulatory Visit (HOSPITAL_COMMUNITY): Payer: BC Managed Care – PPO | Admitting: Anesthesiology

## 2023-02-25 ENCOUNTER — Ambulatory Visit (HOSPITAL_COMMUNITY)
Admission: RE | Admit: 2023-02-25 | Discharge: 2023-02-25 | Disposition: A | Discharge status: Home / Self Care | Payer: BC Managed Care – PPO | Attending: Internal Medicine | Admitting: Internal Medicine

## 2023-02-25 ENCOUNTER — Encounter (HOSPITAL_COMMUNITY)
Admission: RE | Disposition: A | Discharge status: Home / Self Care | Payer: Self-pay | Source: Home / Self Care | Attending: Internal Medicine

## 2023-02-25 ENCOUNTER — Encounter (HOSPITAL_COMMUNITY): Payer: Self-pay

## 2023-02-25 DIAGNOSIS — Z79899 Other long term (current) drug therapy: Secondary | ICD-10-CM | POA: Insufficient documentation

## 2023-02-25 DIAGNOSIS — R197 Diarrhea, unspecified: Secondary | ICD-10-CM

## 2023-02-25 DIAGNOSIS — N189 Chronic kidney disease, unspecified: Secondary | ICD-10-CM | POA: Insufficient documentation

## 2023-02-25 DIAGNOSIS — K5120 Ulcerative (chronic) proctitis without complications: Secondary | ICD-10-CM | POA: Insufficient documentation

## 2023-02-25 DIAGNOSIS — K648 Other hemorrhoids: Secondary | ICD-10-CM | POA: Diagnosis not present

## 2023-02-25 DIAGNOSIS — K5190 Ulcerative colitis, unspecified, without complications: Secondary | ICD-10-CM

## 2023-02-25 DIAGNOSIS — Z6841 Body Mass Index (BMI) 40.0 and over, adult: Secondary | ICD-10-CM | POA: Insufficient documentation

## 2023-02-25 DIAGNOSIS — F988 Other specified behavioral and emotional disorders with onset usually occurring in childhood and adolescence: Secondary | ICD-10-CM | POA: Insufficient documentation

## 2023-02-25 DIAGNOSIS — D124 Benign neoplasm of descending colon: Secondary | ICD-10-CM | POA: Diagnosis not present

## 2023-02-25 DIAGNOSIS — K6289 Other specified diseases of anus and rectum: Secondary | ICD-10-CM

## 2023-02-25 DIAGNOSIS — Z8711 Personal history of peptic ulcer disease: Secondary | ICD-10-CM | POA: Insufficient documentation

## 2023-02-25 DIAGNOSIS — Z905 Acquired absence of kidney: Secondary | ICD-10-CM | POA: Insufficient documentation

## 2023-02-25 DIAGNOSIS — F419 Anxiety disorder, unspecified: Secondary | ICD-10-CM | POA: Insufficient documentation

## 2023-02-25 DIAGNOSIS — I129 Hypertensive chronic kidney disease with stage 1 through stage 4 chronic kidney disease, or unspecified chronic kidney disease: Secondary | ICD-10-CM | POA: Insufficient documentation

## 2023-02-25 DIAGNOSIS — M797 Fibromyalgia: Secondary | ICD-10-CM | POA: Insufficient documentation

## 2023-02-25 HISTORY — PX: POLYPECTOMY: SHX5525

## 2023-02-25 HISTORY — PX: COLONOSCOPY WITH PROPOFOL: SHX5780

## 2023-02-25 HISTORY — PX: BIOPSY: SHX5522

## 2023-02-25 SURGERY — COLONOSCOPY WITH PROPOFOL
Anesthesia: General

## 2023-02-25 MED ORDER — PROPOFOL 500 MG/50ML IV EMUL
INTRAVENOUS | Status: DC | PRN
  Administered 2023-02-25: 150 ug/kg/min via INTRAVENOUS

## 2023-02-25 MED ORDER — PROPOFOL 10 MG/ML IV BOLUS
INTRAVENOUS | Status: DC | PRN
Start: 2023-02-25 — End: 2023-02-25
  Administered 2023-02-25 (×2): 20 mg via INTRAVENOUS
  Administered 2023-02-25: 60 mg via INTRAVENOUS

## 2023-02-25 MED ORDER — LACTATED RINGERS IV SOLN: INTRAVENOUS | Status: DC

## 2023-02-25 MED ORDER — LACTATED RINGERS IV SOLN: INTRAVENOUS | Status: DC | PRN

## 2023-02-25 NOTE — Anesthesia Preprocedure Evaluation (Addendum)
Anesthesia Evaluation  Patient identified by MRN, date of birth, ID band Patient awake    Reviewed: Allergy & Precautions, H&P , NPO status , Patient's Chart, lab work & pertinent test results, reviewed documented beta blocker date and time   Airway Mallampati: II  TM Distance: >3 FB Neck ROM: Full    Dental  (+) Dental Advisory Given, Teeth Intact   Pulmonary PE   Pulmonary exam normal breath sounds clear to auscultation       Cardiovascular Exercise Tolerance: Good hypertension, Pt. on medications and Pt. on home beta blockers Normal cardiovascular exam Rhythm:Regular Rate:Normal     Neuro/Psych  PSYCHIATRIC DISORDERS Anxiety      Neuromuscular disease    GI/Hepatic Neg liver ROS, PUD, Bowel prep,GERD  Controlled,,Ulcerative colitis    Endo/Other    Morbid obesity  Renal/GU Renal InsufficiencyRenal disease (right nephrectomy)  negative genitourinary   Musculoskeletal  (+) Arthritis , Rheumatoid disorders,  Fibromyalgia -  Abdominal   Peds  (+) ATTENTION DEFICIT DISORDER WITHOUT HYPERACTIVITY Hematology negative hematology ROS (+)   Anesthesia Other Findings   Reproductive/Obstetrics negative OB ROS                             Anesthesia Physical Anesthesia Plan  ASA: 3  Anesthesia Plan: General   Post-op Pain Management: Minimal or no pain anticipated   Induction: Intravenous  PONV Risk Score and Plan: Propofol infusion  Airway Management Planned: Nasal Cannula and Natural Airway  Additional Equipment:   Intra-op Plan:   Post-operative Plan:   Informed Consent: I have reviewed the patients History and Physical, chart, labs and discussed the procedure including the risks, benefits and alternatives for the proposed anesthesia with the patient or authorized representative who has indicated his/her understanding and acceptance.     Dental advisory given  Plan Discussed  with: CRNA and Surgeon  Anesthesia Plan Comments:        Anesthesia Quick Evaluation

## 2023-02-25 NOTE — Interval H&P Note (Signed)
History and Physical Interval Note:  02/25/2023 11:32 AM  Darlene Schwartz  has presented today for surgery, with the diagnosis of ULCERATIVE COLITIS, DIARRHEA, CHRONIC FATIGUE.  The various methods of treatment have been discussed with the patient and family. After consideration of risks, benefits and other options for treatment, the patient has consented to  Procedure(s) with comments: COLONOSCOPY WITH PROPOFOL (N/A) - 11:45AM;ASA 3 as a surgical intervention.  The patient's history has been reviewed, patient examined, no change in status, stable for surgery.  I have reviewed the patient's chart and labs.  Questions were answered to the patient's satisfaction.     Lanelle Bal

## 2023-02-25 NOTE — Discharge Instructions (Addendum)
  Colonoscopy Discharge Instructions  Read the instructions outlined below and refer to this sheet in the next few weeks. These discharge instructions provide you with general information on caring for yourself after you leave the hospital. Your doctor may also give you specific instructions. While your treatment has been planned according to the most current medical practices available, unavoidable complications occasionally occur.   ACTIVITY You may resume your regular activity, but move at a slower pace for the next 24 hours.  Take frequent rest periods for the next 24 hours.  Walking will help get rid of the air and reduce the bloated feeling in your belly (abdomen).  No driving for 24 hours (because of the medicine (anesthesia) used during the test).   Do not sign any important legal documents or operate any machinery for 24 hours (because of the anesthesia used during the test).  NUTRITION Drink plenty of fluids.  You may resume your normal diet as instructed by your doctor.  Begin with a light meal and progress to your normal diet. Heavy or fried foods are harder to digest and may make you feel sick to your stomach (nauseated).  Avoid alcoholic beverages for 24 hours or as instructed.  MEDICATIONS You may resume your normal medications unless your doctor tells you otherwise.  WHAT YOU CAN EXPECT TODAY Some feelings of bloating in the abdomen.  Passage of more gas than usual.  Spotting of blood in your stool or on the toilet paper.  IF YOU HAD POLYPS REMOVED DURING THE COLONOSCOPY: No aspirin products for 7 days or as instructed.  No alcohol for 7 days or as instructed.  Eat a soft diet for the next 24 hours.  FINDING OUT THE RESULTS OF YOUR TEST Not all test results are available during your visit. If your test results are not back during the visit, make an appointment with your caregiver to find out the results. Do not assume everything is normal if you have not heard from your  caregiver or the medical facility. It is important for you to follow up on all of your test results.  SEEK IMMEDIATE MEDICAL ATTENTION IF: You have more than a spotting of blood in your stool.  Your belly is swollen (abdominal distention).  You are nauseated or vomiting.  You have a temperature over 101.  You have abdominal pain or discomfort that is severe or gets worse throughout the day.   Your colonoscopy revealed 1 polyp(s) which I removed successfully. Await pathology results, my office will contact you. I recommend repeating colonoscopy in 5 years for surveillance purposes.   I did not see any active inflammation throughout your entire colon.  I did take samples of your entire colon.  We will call with these results as well.  Follow-up in GI office in 2 to 3 months.   I hope you have a great rest of your week!  Hennie Duos. Marletta Lor, D.O. Gastroenterology and Hepatology [facility]

## 2023-02-25 NOTE — Transfer of Care (Signed)
Immediate Anesthesia Transfer of Care Note  Patient: Darlene Schwartz  Procedure(s) Performed: COLONOSCOPY WITH PROPOFOL BIOPSY POLYPECTOMY  Patient Location: PACU  Anesthesia Type:General  Level of Consciousness: sedated  Airway & Oxygen Therapy: Patient Spontanous Breathing  Post-op Assessment: Report given to RN, Post -op Vital signs reviewed and stable, and Patient moving all extremities X 4  Post vital signs: Reviewed and stable  Last Vitals:  Vitals Value Taken Time  BP 104/56 02/25/23 1217  Temp 36.8 C 02/25/23 1217  Pulse 69 02/25/23 1217  Resp 14 02/25/23 1217  SpO2 94 % 02/25/23 1217    Last Pain:  Vitals:   02/25/23 1217  TempSrc: Axillary  PainSc: Asleep         Complications: No notable events documented.

## 2023-02-25 NOTE — Op Note (Signed)
[facility] Patient Name: Darlene Schwartz Procedure Date: 02/25/2023 11:34 AM MRN: [id number] Date of Birth: 1965/09/01 Attending MD: Hennie Duos. 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 Marletta Lor, DO, Angelica Ran, Kristine L.                            Jessee Avers, Technician Referring MD:             Hennie Duos. Marletta Lor, DO Medicines:                See the Anesthesia note for documentation of the                            administered medications Complications:            No immediate complications. Estimated Blood Loss:     Estimated blood loss was minimal. Procedure:                Pre-Anesthesia Assessment:                           - The anesthesia plan was to use monitored                            anesthesia care (MAC).                           After obtaining informed consent, the colonoscope                            was passed under direct vision. Throughout the                            procedure, the patient's blood pressure, pulse, and                            oxygen saturations were monitored continuously. The                            [id number]-[id number] ([id number]) was introduced through the                            anus and advanced to the the cecum, identified by                            appendiceal orifice and ileocecal valve. The                            colonoscopy was performed without difficulty. The  patient tolerated the procedure well. The quality                            of the bowel preparation was evaluated using the                            BBPS [facility] Bowel Preparation Scale) with scores                            of: Right Colon = 2 (minor amount of residual                            staining, small fragments  of stool and/or opaque                            liquid, but mucosa seen well), Transverse Colon = 2                            (minor amount of residual staining, small fragments                            of stool and/or opaque liquid, but mucosa seen                            well) and Left Colon = 2 (minor amount of residual                            staining, small fragments of stool and/or opaque                            liquid, but mucosa seen well). The total BBPS score                            equals 6. Fair. Scope In: 11:49:39 AM Scope Out: 12:14:59 PM Scope Withdrawal Time: 0 hours 15 minutes 22 seconds  Total Procedure Duration: 0 hours 25 minutes 20 seconds  Findings:      Non-bleeding internal hemorrhoids were found during retroflexion.      A 5 mm polyp was found in the descending colon. The polyp was sessile.       The polyp was removed with a cold snare. Resection and retrieval were       complete.      There is no endoscopic evidence of inflammation in the entire colon.      Biopsies were taken with a cold forceps in the entire colon for       histology.      The terminal ileum appeared normal.      A moderate amount of semi-liquid stool was found in the entire colon,       making visualization difficult. Lavage of the area was performed using       copious amounts of sterile water, resulting in clearance with fair       visualization. Impression:               - Non-bleeding internal hemorrhoids.                           -  One 5 mm polyp in the descending colon, removed                            with a cold snare. Resected and retrieved.                           - The examined portion of the ileum was normal.                           - Stool in the entire examined colon.                           - Biopsies were taken with a cold forceps for                            histology in the entire colon. Moderate Sedation:      Per Anesthesia  Care Recommendation:           - Patient has a contact number available for                            emergencies. The signs and symptoms of potential                            delayed complications were discussed with the                            patient. Return to normal activities tomorrow.                            Written discharge instructions were provided to the                            patient.                           - Resume previous diet.                           - Continue present medications.                           - Await pathology results.                           - Repeat colonoscopy in 5 years for surveillance.                           - Return to GI clinic in 3 months. Procedure Code(s):        --- Professional ---                           [id number], Colonoscopy, flexible; with removal of                            tumor(s),  polyp(s), or other lesion(s) by snare                            technique                           45380, 59, Colonoscopy, flexible; with biopsy,                            single or multiple Diagnosis Code(s):        --- Professional ---                           D12.4, Benign neoplasm of descending colon                           K64.8, Other hemorrhoids                           K51.20, Ulcerative (chronic) proctitis without                            complications CPT copyright 2022 American Medical Association. All rights reserved. The codes documented in this report are preliminary and upon coder review may  be revised to meet current compliance requirements. Hennie Duos. Marletta Lor, DO Hennie Duos. Marletta Lor, DO 02/25/2023 12:19:01 PM This report has been signed electronically. Number of Addenda: 0

## 2023-02-25 NOTE — Anesthesia Postprocedure Evaluation (Signed)
Anesthesia Post Note  Patient: ELEINA JERGENS  Procedure(s) Performed: COLONOSCOPY WITH PROPOFOL BIOPSY POLYPECTOMY  Patient location during evaluation: Phase II Anesthesia Type: General Level of consciousness: awake and alert and oriented Pain management: pain level controlled Vital Signs Assessment: post-procedure vital signs reviewed and stable Respiratory status: spontaneous breathing, nonlabored ventilation and respiratory function stable Cardiovascular status: blood pressure returned to baseline and stable Postop Assessment: no apparent nausea or vomiting Anesthetic complications: no  No notable events documented.   Last Vitals:  Vitals:   02/25/23 0959 02/25/23 1217  BP: 120/65 (!) 104/56  Pulse: 75 69  Resp: 18 14  Temp: 36.9 C 36.8 C  SpO2: 96% 94%    Last Pain:  Vitals:   02/25/23 1217  TempSrc: Axillary  PainSc: Asleep                 Leanda Padmore C [place]

## 2023-02-26 LAB — SURGICAL PATHOLOGY

## 2023-03-04 ENCOUNTER — Encounter (HOSPITAL_COMMUNITY): Payer: Self-pay | Admitting: Internal Medicine

## 2023-03-11 ENCOUNTER — Other Ambulatory Visit: Payer: Self-pay | Admitting: Internal Medicine

## 2023-03-11 ENCOUNTER — Telehealth: Payer: Self-pay

## 2023-03-11 MED ORDER — MESALAMINE 1000 MG RE SUPP: 1000.0000 mg | Freq: Every day | RECTAL | 3 refills | Status: DC

## 2023-03-11 NOTE — Telephone Encounter (Signed)
I just sent to pharmacy. Please check BMP in 2 weeks. Adding Toni Amend so she is aware. Thank you

## 2023-03-11 NOTE — Telephone Encounter (Signed)
Pt called back regarding Mesalamine suppositories (pt had phoned back 7/24). She states she was advised after the colonoscopy some would be sent in for her. Please advise

## 2023-03-12 ENCOUNTER — Other Ambulatory Visit: Payer: Self-pay

## 2023-03-12 DIAGNOSIS — R5382 Chronic fatigue, unspecified: Secondary | ICD-10-CM

## 2023-03-12 DIAGNOSIS — K5120 Ulcerative (chronic) proctitis without complications: Secondary | ICD-10-CM

## 2023-03-12 NOTE — Telephone Encounter (Signed)
Phoned and advised the pt of the Rx's being sent to the new pharmacy and blood work at [facility] in 2 weeks (mailed forms to the pt). Pt expressed understanding

## 2023-05-07 ENCOUNTER — Ambulatory Visit: Payer: BC Managed Care – PPO | Admitting: Gastroenterology

## 2023-05-07 ENCOUNTER — Encounter: Payer: Self-pay | Admitting: Gastroenterology

## 2023-05-07 NOTE — Progress Notes (Deleted)
GI Office Note    Referring Provider: Achilles Dunk, DO Primary Care Physician:  Achilles Dunk, DO Primary Gastroenterologist: Hennie Duos. Marletta Lor, DO  Date:  05/07/2023  ID:  Darlene Schwartz, DOB 05-Aug-1966, MRN [id number]   Chief Complaint   No chief complaint on file.  History of Present Illness  Darlene Schwartz is a 57 y.o. female with a history of ulcerative proctocolitis, rheumatoid arthritis, fibromyalgia, anxiety, CKD, ADD, HTN, IBS, and PE post nephrectomy presenting today for follow-up of UC/diarrhea post colonoscopy.  Per review of chart it appears patient was diagnosed with ulcerative proctitis as a teenager.   Colonoscopy April 2016: -Normal ileum -Redundant left colon -Moderate proctitis -Moderate to large external hemorrhoids. -Biopsy revealed quiescent chronic colitis of the rectum. -Advised to follow high-fiber diet and start Canasa suppositories twice daily, take dicyclomine before breakfast and lunch 3 days a week.  Avoid NSAIDs. -Follow-up with Dr. Lovell Sheehan for consideration of hemorrhoidectomy   Per follow-up in November 2016 Dr. Darrick Penna recommended CT abdomen pelvis to look for active disease proximal to the rectum which was identified on her most recent colonoscopy and felt as though given she only has 1 kidney that oral mesalamine should not be used unless absolutely necessary and if there was no evidence of active disease on CT scan then they could concentrate on IBS-D therapy and consider Viberzi for her abdominal pain and diarrhea.  She was advised to complete clindamycin and call with within 2 weeks.  She was to let Darlene Schwartz know if she wanted to pursue CT scan.  Last office visit 02/05/23. Sees rheumatology for her RA.  Had a recent ED visit for dehydration from vomiting for several weeks, this started randomly.  States she takes half of phentermine as needed to help with ADHD and chronic fatigue.  Earlier in May she had a lack of appetite and lost 15 pounds within 3  weeks.  Generally having about 3 stools per day, usually occurring after meals and stool seem to be more yellow in nature but denied any bleeding.  Stools been loose but not watery.  Using dicyclomine as needed.  Beef cause worsening symptoms.  Reports severe joint pain and fibromyalgia.  Patient reported history of PE in 2000 03/2008.  Denied acid reflux.  Taking vitamin D and B12.  Advised to continue follow-up with dermatology, schedule colonoscopy.  Continue Bentyl as needed.  Use Pepcid nightly as needed.  Colonoscopy 02/25/23: -Nonbleeding internal hemorrhoids -5 mm polyp in the descending colon -No endoscopic evidence of inflammation in the entire colon -TI appeared normal -Moderate amount of semiliquid stool in the entire colon making visualization difficult, clearance received with lavage -Random biopsies taken: Without specific changes, no acute inflammation -Pathology revealed tubular adenoma -Rectal biopsies with chronic inactive proctitis manifested only by very focal Paneth cell metaplasia -Repeat colonoscopy in 5 years for surveillance -Advised to consider mesalamine suppositories  Today:  Current therapy: ***  Last flu shot:*** Last pneumonia shot:*** Last Pap smear: *** Last evaluation by dermatology: *** Last zoster vaccine: *** Last DEXA scan: *** COVID-19 shot: ***   Current Outpatient Medications  Medication Sig Dispense Refill   acetaminophen (TYLENOL) 500 MG tablet Take 1,000 mg by mouth every 6 (six) hours as needed for mild pain or moderate pain.     amitriptyline (ELAVIL) 75 MG tablet Take 150 mg by mouth at bedtime.     atenolol (TENORMIN) 25 MG tablet Take 12.5 mg by mouth 2 (two) times daily. Pt  has been out of this medication for 2 weeks     citalopram (CELEXA) 40 MG tablet Take 40 mg by mouth at bedtime.     Cyanocobalamin (VITAMIN B-12) 2500 MCG SUBL Place under the tongue.     dicyclomine (BENTYL) 10 MG capsule TAKE 1 CAPSULE (10 MG TOTAL) BY MOUTH 4  (FOUR) TIMES DAILY AS NEEDED. 120 capsule 4   HYDROcodone-acetaminophen (NORCO) 10-325 MG tablet Take 1 tablet by mouth every 6 (six) hours as needed for moderate pain or severe pain. 2-3 times daily     LORazepam (ATIVAN) 0.5 MG tablet Take 0.5 mg by mouth 2 (two) times daily as needed for anxiety.     mesalamine (CANASA) 1000 MG suppository Place 1 suppository (1,000 mg total) rectally at bedtime. 30 suppository 3   NON FORMULARY Cortizone injections tri monthly   In hip joints     phentermine (ADIPEX-P) 37.5 MG tablet Take 18.75 mg by mouth daily as needed (fatigue).     predniSONE (DELTASONE) 5 MG tablet Take 5 mg by mouth 2 (two) times daily with a meal.     tiZANidine (ZANAFLEX) 4 MG tablet Take 8 mg by mouth at bedtime.     Vitamin D, Ergocalciferol, (DRISDOL) 1.25 MG (50000 UNIT) CAPS capsule Take 50,000 Units by mouth every 7 (seven) days.     No current facility-administered medications for this visit.    Past Medical History:  Diagnosis Date   ADD (attention deficit disorder)    Anxiety    Anxiety    Arthritis    Chronic kidney disease    right nephrectomy   Chronic sinus infection    Fibromyalgia    GERD (gastroesophageal reflux disease)    History of blood clots    Hypertension    IBS (irritable bowel syndrome)    Panic attacks    Pulmonary embolism (HCC)    occured after nephrectomy; was on blood thinners for about 6 months post nephrectomy.   Rheumatoid arthritis (HCC)    Ulcerative colitis (HCC)    Vertigo     Past Surgical History:  Procedure Laterality Date   ABDOMINAL HYSTERECTOMY     ADENOIDECTOMY     BIOPSY  11/23/2014   Procedure: BIOPSY;  Surgeon: West Bali, MD;  Location: [facility];  Service: Endoscopy;;  Right Colon, Left Colon, Rectal   BIOPSY  02/25/2023   Procedure: BIOPSY;  Surgeon: Lanelle Bal, DO;  Location: [facility];  Service: Endoscopy;;   COLONOSCOPY WITH PROPOFOL N/A 11/23/2014   SLF: 1. Normal Ileum 2. Left colon is redundant  3. Moderate proctitis.    COLONOSCOPY WITH PROPOFOL N/A 02/25/2023   Procedure: COLONOSCOPY WITH PROPOFOL;  Surgeon: Lanelle Bal, DO;  Location: [facility];  Service: Endoscopy;  Laterality: N/A;  11:45AM;ASA 3   DIAGNOSTIC LAPAROSCOPY     KNEE ARTHROSCOPY Bilateral    NEPHRECTOMY  2008   right side   POLYPECTOMY  02/25/2023   Procedure: POLYPECTOMY;  Surgeon: Lanelle Bal, DO;  Location: [facility];  Service: Endoscopy;;   TONSILLECTOMY      Family History  Problem Relation Age of Onset   Colon cancer Paternal Grandfather 16   Ulcerative colitis Cousin    Rheum arthritis Mother    Allergic rhinitis Mother    Rheum arthritis Paternal Grandmother    Diverticulitis Father    Prostate cancer Father    Colon polyps Neg Hx    Angioedema Neg Hx    Asthma  Neg Hx    Atopy Neg Hx    Eczema Neg Hx    Immunodeficiency Neg Hx    Urticaria Neg Hx     Allergies as of 05/07/2023 - Review Complete 02/25/2023  Allergen Reaction Noted   Other  05/16/2017   Penicillins Itching 01/23/2012   Sulfa antibiotics  08/05/2019   Sulfamethoxazole-trimethoprim  02/18/2023   Adhesive [tape] Rash 01/23/2012    Social History   Socioeconomic History   Marital status: Married    Spouse name: Willaim   Number of children: 5   Years of education: Not on file   Highest education level: Master's degree (e.g., MA, MS, MEng, MEd, MSW, MBA)  Occupational History   Occupation: N/A  Tobacco Use   Smoking status: Never   Smokeless tobacco: Never   Tobacco comments:    Never smoked  Vaping Use   Vaping status: Never Used  Substance and Sexual Activity   Alcohol use: No    Alcohol/week: 0.0 standard drinks of alcohol   Drug use: No   Sexual activity: Yes    Birth control/protection: Surgical  Other Topics Concern   Not on file  Social History Narrative   ADOPTED TWIN BOYS: AGE 93. MARRIED-4 YRS(2ND MARRIAGE). 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  FORMER COMPETITIVE SKATER.   Left handed    Caffeine 1-2 cups daily    Lives at home with spouse and kids    Social Determinants of Health   Financial Resource Strain: Not on file  Food Insecurity: Not on file  Transportation Needs: Not on file  Physical Activity: Not on file  Stress: Not on file  Social Connections: Not on file     Review of Systems   Gen: Denies fever, chills, anorexia. Denies fatigue, weakness, weight loss.  CV: Denies chest pain, palpitations, syncope, peripheral edema, and claudication. Resp: Denies dyspnea at rest, cough, wheezing, coughing up blood, and pleurisy. GI: See HPI Derm: Denies rash, itching, dry skin Psych: Denies depression, anxiety, memory loss, confusion. No homicidal or suicidal ideation.  Heme: Denies bruising, bleeding, and enlarged lymph nodes.   Physical Exam   There were no vitals taken for this visit.  General:   Alert and oriented. No distress noted. Pleasant and cooperative.  Head:  Normocephalic and atraumatic. Eyes:  Conjuctiva clear without scleral icterus. Mouth:  Oral mucosa pink and moist. Good dentition. No lesions. Lungs:  Clear to auscultation bilaterally. No wheezes, rales, or rhonchi. No distress.  Heart:  S1, S2 present without murmurs appreciated.  Abdomen:  +BS, soft, non-tender and non-distended. No rebound or guarding. No HSM or masses noted. Rectal: *** Msk:  Symmetrical without gross deformities. Normal posture. Extremities:  Without edema. Neurologic:  Alert and  oriented x4 Psych:  Alert and cooperative. Normal mood and affect.   Assessment  Darlene Schwartz is a 57 y.o. female with a history of ulcerative proctocolitis, rheumatoid arthritis, fibromyalgia, anxiety, CKD, ADD, HTN, IBS, and PE post nephrectomy presenting today for follow-up of UC/diarrhea post colonoscopy.  Ulcerative Proctocolitis:   Hemorrhoids:   PLAN   *** CBC, CMP, CRP, fecal cal, Hep B sAg, Quantiferon Canasa  suppositories Follow up ***    Brooke Bonito, MSN, FNP-BC, AGACNP-BC [facility]

## 2023-06-02 ENCOUNTER — Other Ambulatory Visit: Payer: Self-pay | Admitting: Internal Medicine

## 2023-08-28 ENCOUNTER — Emergency Department (HOSPITAL_COMMUNITY)
Admission: EM | Admit: 2023-08-28 | Discharge: 2023-08-29 | Disposition: A | Discharge status: Home / Self Care | Payer: BC Managed Care – PPO | Attending: Emergency Medicine | Admitting: Emergency Medicine

## 2023-08-28 ENCOUNTER — Other Ambulatory Visit: Payer: Self-pay

## 2023-08-28 ENCOUNTER — Encounter (HOSPITAL_COMMUNITY): Payer: Self-pay

## 2023-08-28 DIAGNOSIS — N189 Chronic kidney disease, unspecified: Secondary | ICD-10-CM | POA: Insufficient documentation

## 2023-08-28 DIAGNOSIS — N390 Urinary tract infection, site not specified: Secondary | ICD-10-CM | POA: Diagnosis not present

## 2023-08-28 DIAGNOSIS — Z1152 Encounter for screening for COVID-19: Secondary | ICD-10-CM | POA: Diagnosis not present

## 2023-08-28 DIAGNOSIS — Z91040 Latex allergy status: Secondary | ICD-10-CM | POA: Diagnosis not present

## 2023-08-28 DIAGNOSIS — F419 Anxiety disorder, unspecified: Secondary | ICD-10-CM | POA: Diagnosis not present

## 2023-08-28 DIAGNOSIS — I129 Hypertensive chronic kidney disease with stage 1 through stage 4 chronic kidney disease, or unspecified chronic kidney disease: Secondary | ICD-10-CM | POA: Insufficient documentation

## 2023-08-28 DIAGNOSIS — R300 Dysuria: Secondary | ICD-10-CM | POA: Diagnosis present

## 2023-08-28 LAB — CBC WITH DIFFERENTIAL/PLATELET
Abs Immature Granulocytes: 0.03 10*3/uL (ref 0.00–0.07)
Basophils Absolute: 0 10*3/uL (ref 0.0–0.1)
Basophils Relative: 0 %
Eosinophils Absolute: 0.2 10*3/uL (ref 0.0–0.5)
Eosinophils Relative: 2 %
HCT: 44.9 % (ref 36.0–46.0)
Hemoglobin: 14.2 g/dL (ref 12.0–15.0)
Immature Granulocytes: 0 %
Lymphocytes Relative: 25 %
Lymphs Abs: 2.3 10*3/uL (ref 0.7–4.0)
MCH: 30.7 pg (ref 26.0–34.0)
MCHC: 31.6 g/dL (ref 30.0–36.0)
MCV: 97.2 fL (ref 80.0–100.0)
Monocytes Absolute: 0.9 10*3/uL (ref 0.1–1.0)
Monocytes Relative: 10 %
Neutro Abs: 5.7 10*3/uL (ref 1.7–7.7)
Neutrophils Relative %: 63 %
Platelets: 311 10*3/uL (ref 150–400)
RBC: 4.62 MIL/uL (ref 3.87–5.11)
RDW: 12.2 % (ref 11.5–15.5)
WBC: 9.1 10*3/uL (ref 4.0–10.5)
nRBC: 0 % (ref 0.0–0.2)

## 2023-08-28 LAB — COMPREHENSIVE METABOLIC PANEL
ALT: 20 U/L (ref 0–44)
AST: 18 U/L (ref 15–41)
Albumin: 3.8 g/dL (ref 3.5–5.0)
Alkaline Phosphatase: 65 U/L (ref 38–126)
Anion gap: 12 (ref 5–15)
BUN: 16 mg/dL (ref 6–20)
CO2: 22 mmol/L (ref 22–32)
Calcium: 9.2 mg/dL (ref 8.9–10.3)
Chloride: 105 mmol/L (ref 98–111)
Creatinine, Ser: 1.02 mg/dL — ABNORMAL HIGH (ref 0.44–1.00)
GFR, Estimated: >60 mL/min (ref >60)
Glucose, Bld: 83 mg/dL (ref 70–99)
Potassium: 4.1 mmol/L (ref 3.5–5.1)
Sodium: 139 mmol/L (ref 135–145)
Total Bilirubin: 1.1 mg/dL (ref 0.0–1.2)
Total Protein: 6.6 g/dL (ref 6.5–8.1)

## 2023-08-28 LAB — RESP PANEL BY RT-PCR (RSV, FLU A&B, COVID)  RVPGX2
Influenza A by PCR: NEGATIVE
Influenza B by PCR: NEGATIVE
Resp Syncytial Virus by PCR: NEGATIVE
SARS Coronavirus 2 by RT PCR: NEGATIVE

## 2023-08-28 MED ORDER — METOCLOPRAMIDE HCL 5 MG/ML IJ SOLN
10.0000 mg | Freq: Once | INTRAMUSCULAR | Status: AC
  Administered 2023-08-29: 10 mg via INTRAVENOUS
  Filled 2023-08-28: qty 2

## 2023-08-28 MED ORDER — ONDANSETRON HCL 4 MG/2ML IJ SOLN
4.0000 mg | Freq: Once | INTRAMUSCULAR | Status: AC
  Administered 2023-08-28: 4 mg via INTRAVENOUS
  Filled 2023-08-28: qty 2

## 2023-08-28 MED ORDER — LORAZEPAM 0.5 MG PO TABS
0.5000 mg | ORAL_TABLET | Freq: Once | ORAL | Status: AC
  Administered 2023-08-28: 0.5 mg via ORAL
  Filled 2023-08-28: qty 1

## 2023-08-28 MED ORDER — SODIUM CHLORIDE 0.9 % IV BOLUS
500.0000 mL | Freq: Once | INTRAVENOUS | Status: AC
  Administered 2023-08-28: 500 mL via INTRAVENOUS

## 2023-08-28 MED ORDER — ACETAMINOPHEN 500 MG PO TABS
1000.0000 mg | ORAL_TABLET | Freq: Once | ORAL | Status: AC
  Administered 2023-08-29: 1000 mg via ORAL
  Filled 2023-08-28: qty 2

## 2023-08-28 MED ORDER — KETOROLAC TROMETHAMINE 15 MG/ML IJ SOLN
15.0000 mg | Freq: Once | INTRAMUSCULAR | Status: AC
  Administered 2023-08-29: 15 mg via INTRAVENOUS
  Filled 2023-08-28: qty 1

## 2023-08-28 NOTE — ED Notes (Signed)
 Pt endorses she is not able to give a urine sample at this time. Pt was made aware by this RN that we will need a sample as soon as she can.

## 2023-08-28 NOTE — ED Provider Notes (Signed)
[place] EMERGENCY DEPARTMENT AT [facility] Provider Note   CSN: [id number] Arrival date & time: 08/28/23  1926     History  Chief Complaint  Patient presents with   Nausea    Darlene Schwartz is a 58 y.o. female with PMH as listed below who presents with nausea, not feeling well. Patient says she "just feels really sick, c/o lower abd pain, burning with urination and chills with body aches" that started a couple of days ago.  During the interview patient is extremely upset, tearful, repeatedly stating she is very anxious and scared, does not answer some questions due to emotional distress.  Patient's friend is at bedside and states that patient is extremely anxious at baseline and is improved after she gets her lorazepam.  She ran out of her Ativan yesterday and has not taken it since then.  Patient also relays that she is very afraid due to her mother being ill and is worried that she will not see her mother again because her mother will pass away.  Patient has a history of RA, UC, HTN, IBS, anxiety/panic, status post right nephrectomy, fibromyalgia.  She denies any specific chest pain, abdominal pain, shortness of breath. Denies vomiting/diarrhea/constipation, cough, falls, head trauma, syncope. Endorses poor PO intake.   Past Medical History:  Diagnosis Date   ADD (attention deficit disorder)    Anxiety    Anxiety    Arthritis    Chronic kidney disease    right nephrectomy   Chronic sinus infection    Fibromyalgia    GERD (gastroesophageal reflux disease)    History of blood clots    Hypertension    IBS (irritable bowel syndrome)    Panic attacks    Pulmonary embolism (HCC)    occured after nephrectomy; was on blood thinners for about 6 months post nephrectomy.   Rheumatoid arthritis (HCC)    Ulcerative colitis (HCC)    Vertigo        Home Medications Prior to Admission medications   Medication Sig Start Date End Date Taking? Authorizing Provider   acetaminophen (TYLENOL) 500 MG tablet Take 1,000 mg by mouth every 6 (six) hours as needed for mild pain or moderate pain.   Yes [provider]  amitriptyline (ELAVIL) 75 MG tablet Take 150 mg by mouth at bedtime. 11/10/19  Yes [provider]  atenolol (TENORMIN) 25 MG tablet Take 12.5 mg by mouth 2 (two) times daily. Pt has been out of this medication for 2 weeks   Yes [provider]  cephALEXin (KEFLEX) 500 MG capsule Take 1 capsule (500 mg total) by mouth 4 (four) times daily. 08/29/23  Yes Mesner, Barbara Cower, MD  citalopram (CELEXA) 40 MG tablet Take 40 mg by mouth at bedtime.   Yes [provider]  Cyanocobalamin (VITAMIN B-12) 2500 MCG SUBL Place under the tongue.   Yes [provider]  dicyclomine (BENTYL) 10 MG capsule TAKE 1 CAPSULE (10 MG TOTAL) BY MOUTH 4 (FOUR) TIMES DAILY AS NEEDED. 05/12/18  Yes Gelene Mink, NP  HYDROcodone-acetaminophen (NORCO) 10-325 MG tablet Take 1 tablet by mouth every 6 (six) hours as needed for moderate pain or severe pain. 2-3 times daily   Yes [provider]  LORazepam (ATIVAN) 1 MG tablet Take 0.5 tablets (0.5 mg total) by mouth 3 (three) times daily as needed for anxiety. 08/29/23  Yes Mesner, Barbara Cower, MD  NON FORMULARY Cortizone injections tri monthly   In hip joints   Yes [provider]  phentermine (ADIPEX-P) 37.5 MG tablet Take 18.75 mg by mouth daily as needed (fatigue). 02/04/23  Yes [provider]  predniSONE (DELTASONE) 5 MG tablet Take 5 mg by mouth 2 (two) times daily with a meal.   Yes [provider]  tiZANidine (ZANAFLEX) 4 MG tablet Take 8 mg by mouth at bedtime.   Yes [provider]  Vitamin D, Ergocalciferol, (DRISDOL) 1.25 MG (50000 UNIT) CAPS capsule Take 50,000 Units by mouth every 7 (seven) days.   Yes [provider]  ondansetron (ZOFRAN) 4 MG tablet Take 1 tablet (4 mg total) by mouth every 8 (eight) hours as needed for nausea or vomiting.  08/29/23   Mesner, Barbara Cower, MD      Allergies    Other, Penicillins, Sulfamethoxazole-trimethoprim, Adhesive [tape], and Latex    Review of Systems   Review of Systems A 10 point review of systems was performed and is negative unless otherwise reported in HPI.  Physical Exam Updated Vital Signs BP (!) 117/46   Pulse 92   Temp 98.2 F (36.8 C) (Oral)   Resp 16   Ht 5\' 6"  (1.676 m)   Wt 104.3 kg   SpO2 96%   BMI 37.12 kg/m  Physical Exam General: Normal appearing obese female, lying in bed.  HEENT: NCAT, PERRLA, Sclera anicteric, MMM, trachea midline.  Cardiology: RRR, no murmurs/rubs/gallops.  Resp: Normal respiratory rate and effort. CTAB, no wheezes, rhonchi, crackles.  Abd: Soft, mild suprapubic TTP, non-distended. No rebound tenderness or guarding.  GU: Deferred. MSK: No peripheral edema or signs of trauma. Extremities without deformity or TTP. Skin: warm, dry.  Back: No CVA tenderness Neuro: A&Ox4, CNs II-XII grossly intact. MAEs. Sensation grossly intact.  Psych: Extremely anxious, mildly hyperventilating, tearful, at times not redirectable. She calms somewhat with coaching.   ED Results / Procedures / Treatments   Labs (all labs ordered are listed, but only abnormal results are displayed) Labs Reviewed  COMPREHENSIVE METABOLIC PANEL - Abnormal; Notable for the following components:   Creatinine, Ser 1.02 (*)    All other components within normal limits  URINALYSIS, W/ REFLEX TO CULTURE (INFECTION SUSPECTED) - Abnormal; Notable for the following components:   Color, Urine AMBER (*)    Ketones, ur 20 (*)    Nitrite POSITIVE (*)    Bacteria, UA RARE (*)    All other components within normal limits  RESP PANEL BY RT-PCR (RSV, FLU A&B, COVID)  RVPGX2  CBC WITH DIFFERENTIAL/PLATELET    EKG None  Radiology No results found.  Procedures Procedures    Medications Ordered in ED Medications  ondansetron (ZOFRAN) injection 4 mg (4 mg Intravenous Given 08/28/23  2053)  LORazepam (ATIVAN) tablet 0.5 mg (0.5 mg Oral Given 08/28/23 2139)  sodium chloride 0.9 % bolus 500 mL (0 mLs Intravenous Stopped 08/28/23 2355)  metoCLOPramide (REGLAN) injection 10 mg (10 mg Intravenous Given 08/29/23 0031)  ketorolac (TORADOL) 15 MG/ML injection 15 mg (15 mg Intravenous Given 08/29/23 0037)  acetaminophen (TYLENOL) tablet 1,000 mg (1,000 mg Oral Given 08/29/23 0030)  cefTRIAXone (ROCEPHIN) 2 g in sodium chloride 0.9 % 100 mL IVPB (0 g Intravenous Stopped 08/29/23 0219)    ED Course/ Medical Decision Making/ A&P                          Medical Decision Making Amount and/or Complexity of Data Reviewed Labs: ordered. Decision-making details documented in ED Course.  Risk OTC drugs. Prescription drug  management.    This patient presents to the ED for concern of nausea, not feeling well, this involves an extensive number of treatment options, and is a complaint that carries with it a high risk of complications and morbidity.  I considered the following differential and admission for this acute, potentially life threatening condition. Pt is overall well appearing and extremely anxious.  MDM:    She complains of nausea, dysuria, lower abdominal pain and has mild suprapubic tenderness palpation on exam.  Greatest concern for cystitis.  No CVA tenderness indicate pyelonephritis or ureterolithiasis.  She is not febrile or tachycardic that would indicate sepsis or life-threatening bacterial infection causing bacteremia.  CBC does not have any leukocytosis or anemia, CMP without any hypo/hyperglycemia or electrolyte derangements or renal injury.  Rester panel does not indicate any COVID/flu/RSV but still could could have a viral syndrome as a cause of her symptoms.  She has no guarding or peritoneal signs on abdominal exam to indicate appendicitis, diverticulitis. SBO.  I do feel that patient's anxiety is contributing to her symptoms, as she ran out of her lorazepam yesterday.   After treatment with Ativan, antiemetics, Tylenol/Toradol, patient feels improved.  Clinical Course as of 09/04/23 1507  Wed Aug 28, 2023  2133 CBC with Differential wnl [HN]  2133 Comprehensive metabolic panel(!) Unremarkable in the context of this patient's presentation  [HN]  2311 Resp panel by RT-PCR (RSV, Flu A&B, Covid) Anterior Nasal Swab neg [HN]    Clinical Course User Index [HN] Loetta Rough, MD    Labs: I Ordered, and personally interpreted labs.  The pertinent results include:  those listed above  Additional history obtained from friend at bedside, chart review.    Reevaluation: After the interventions noted above, I reevaluated the patient and found that they have :improved  Social Determinants of Health: Lives independently  Disposition:  Patient is signed out to the oncoming ED physician Dr. Clayborne Dana who is made aware of her history, presentation, exam, workup, and plan. Plan is to obtain urine, likely DC w/ PCP f/u.    Co morbidities that complicate the patient evaluation  Past Medical History:  Diagnosis Date   ADD (attention deficit disorder)    Anxiety    Anxiety    Arthritis    Chronic kidney disease    right nephrectomy   Chronic sinus infection    Fibromyalgia    GERD (gastroesophageal reflux disease)    History of blood clots    Hypertension    IBS (irritable bowel syndrome)    Panic attacks    Pulmonary embolism (HCC)    occured after nephrectomy; was on blood thinners for about 6 months post nephrectomy.   Rheumatoid arthritis (HCC)    Ulcerative colitis (HCC)    Vertigo      Medicines Meds ordered this encounter  Medications   ondansetron (ZOFRAN) injection 4 mg   LORazepam (ATIVAN) tablet 0.5 mg   sodium chloride 0.9 % bolus 500 mL   metoCLOPramide (REGLAN) injection 10 mg   ketorolac (TORADOL) 15 MG/ML injection 15 mg   acetaminophen (TYLENOL) tablet 1,000 mg   cefTRIAXone (ROCEPHIN) 2 g in sodium chloride 0.9 % 100 mL IVPB     Antibiotic Indication::   UTI   cephALEXin (KEFLEX) 500 MG capsule    Sig: Take 1 capsule (500 mg total) by mouth 4 (four) times daily.    Dispense:  40 capsule    Refill:  0   LORazepam (ATIVAN) 1 MG tablet  Sig: Take 0.5 tablets (0.5 mg total) by mouth 3 (three) times daily as needed for anxiety.    Dispense:  10 tablet    Refill:  0   DISCONTD: ondansetron (ZOFRAN) 4 MG tablet    Sig: Take 1 tablet (4 mg total) by mouth every 8 (eight) hours as needed for nausea or vomiting.    Dispense:  30 tablet    Refill:  0   ondansetron (ZOFRAN) 4 MG tablet    Sig: Take 1 tablet (4 mg total) by mouth every 8 (eight) hours as needed for nausea or vomiting.    Dispense:  30 tablet    Refill:  0    I have reviewed the patients home medicines and have made adjustments as needed  Problem List / ED Course: Problem List Items Addressed This Visit   None Visit Diagnoses       Urinary tract infection without hematuria, site unspecified    -  Primary   Relevant Medications   cefTRIAXone (ROCEPHIN) 2 g in sodium chloride 0.9 % 100 mL IVPB (Completed)   cephALEXin (KEFLEX) 500 MG capsule                   This note was created using dictation software, which may contain spelling or grammatical errors.    Loetta Rough, MD 09/04/23 [phone]

## 2023-08-28 NOTE — ED Triage Notes (Addendum)
 Pt presents to ED from home with c/o nausea. Pt says she "just feels really sick, c/o lower abd pain, burning with urination and chills with body aches", pt has one kidney. Sx started yesterday.

## 2023-08-28 NOTE — ED Provider Notes (Signed)
11:52 PM Assumed care from Dr. Jearld Fenton, please see their note for full history, physical and decision making until this point. In brief this is a 58 y.o. year old female who presented to the ED tonight with Nausea     Eval for UTI pending UA and reeval for improved symptoms, nausea.   UA infected, culture sent. VSS. Feeling better. Will dc on abx and short refill of ativan (takes it daily but ran out yesterday likely contributing to symptoms). One kidney, baseline renal function but will treat with longer course of abx. Will return if not improving in a few days or any worsening symptoms.   Discharge instructions, including strict return precautions for new or worsening symptoms, given. Patient and/or family verbalized understanding and agreement with the plan as described.   Labs, studies and imaging reviewed by myself and considered in medical decision making if ordered. Imaging interpreted by radiology.  Labs Reviewed  COMPREHENSIVE METABOLIC PANEL - Abnormal; Notable for the following components:      Result Value   Creatinine, Ser 1.02 (*)    All other components within normal limits  RESP PANEL BY RT-PCR (RSV, FLU A&B, COVID)  RVPGX2  CBC WITH DIFFERENTIAL/PLATELET  URINALYSIS, W/ REFLEX TO CULTURE (INFECTION SUSPECTED)    No orders to display    No follow-ups on file.    Milika Ventress, Barbara Cower, MD 08/29/23 [phone]

## 2023-08-29 LAB — URINALYSIS, W/ REFLEX TO CULTURE (INFECTION SUSPECTED)
Bilirubin Urine: NEGATIVE
Glucose, UA: NEGATIVE mg/dL
Hgb urine dipstick: NEGATIVE
Ketones, ur: 20 mg/dL — AB
Leukocytes,Ua: NEGATIVE
Nitrite: POSITIVE — AB
Protein, ur: NEGATIVE mg/dL
Specific Gravity, Urine: 1.023 (ref 1.005–1.030)
pH: 6 (ref 5.0–8.0)

## 2023-08-29 MED ORDER — LORAZEPAM 1 MG PO TABS: 0.5000 mg | ORAL_TABLET | Freq: Three times a day (TID) | ORAL | 0 refills | Status: AC | PRN

## 2023-08-29 MED ORDER — SODIUM CHLORIDE 0.9 % IV SOLN
2.0000 g | Freq: Once | INTRAVENOUS | Status: AC
  Administered 2023-08-29: 2 g via INTRAVENOUS
  Filled 2023-08-29: qty 20

## 2023-08-29 MED ORDER — CEPHALEXIN 500 MG PO CAPS: 500.0000 mg | ORAL_CAPSULE | Freq: Four times a day (QID) | ORAL | 0 refills | Status: AC

## 2023-08-29 MED ORDER — ONDANSETRON HCL 4 MG PO TABS: 4.0000 mg | ORAL_TABLET | Freq: Three times a day (TID) | ORAL | 0 refills | Status: AC | PRN

## 2023-08-29 MED ORDER — ONDANSETRON HCL 4 MG PO TABS: 4.0000 mg | ORAL_TABLET | Freq: Three times a day (TID) | ORAL | 0 refills | Status: DC | PRN

## 2023-08-30 LAB — URINE CULTURE: Culture: <10000 — AB
